# Patient Record
Sex: Female | Born: 1995
Health system: Southern US, Community
[De-identification: ages and names within clinical notes are randomized; demographics above are authoritative.]

## PROBLEM LIST (undated history)

## (undated) DIAGNOSIS — F32A Depression, unspecified: Secondary | ICD-10-CM

## (undated) DIAGNOSIS — F329 Major depressive disorder, single episode, unspecified: Secondary | ICD-10-CM

## (undated) DIAGNOSIS — M419 Scoliosis, unspecified: Secondary | ICD-10-CM

## (undated) DIAGNOSIS — F419 Anxiety disorder, unspecified: Secondary | ICD-10-CM

## (undated) HISTORY — PX: ABDOMINAL SURGERY: SHX537

## (undated) HISTORY — PX: TONSILLECTOMY: SUR1361

---

## 2016-03-04 DIAGNOSIS — J209 Acute bronchitis, unspecified: Secondary | ICD-10-CM | POA: Diagnosis not present

## 2016-03-04 DIAGNOSIS — R072 Precordial pain: Secondary | ICD-10-CM | POA: Diagnosis not present

## 2016-03-26 DIAGNOSIS — Z6838 Body mass index (BMI) 38.0-38.9, adult: Secondary | ICD-10-CM | POA: Diagnosis not present

## 2016-03-26 DIAGNOSIS — K5900 Constipation, unspecified: Secondary | ICD-10-CM | POA: Diagnosis not present

## 2016-07-24 DIAGNOSIS — Z202 Contact with and (suspected) exposure to infections with a predominantly sexual mode of transmission: Secondary | ICD-10-CM | POA: Diagnosis not present

## 2016-07-24 DIAGNOSIS — Z6837 Body mass index (BMI) 37.0-37.9, adult: Secondary | ICD-10-CM | POA: Diagnosis not present

## 2016-07-24 DIAGNOSIS — N949 Unspecified condition associated with female genital organs and menstrual cycle: Secondary | ICD-10-CM | POA: Diagnosis not present

## 2016-07-25 DIAGNOSIS — Z202 Contact with and (suspected) exposure to infections with a predominantly sexual mode of transmission: Secondary | ICD-10-CM | POA: Diagnosis not present

## 2016-07-25 DIAGNOSIS — N949 Unspecified condition associated with female genital organs and menstrual cycle: Secondary | ICD-10-CM | POA: Diagnosis not present

## 2016-07-29 DIAGNOSIS — Z202 Contact with and (suspected) exposure to infections with a predominantly sexual mode of transmission: Secondary | ICD-10-CM | POA: Diagnosis not present

## 2016-08-15 DIAGNOSIS — J029 Acute pharyngitis, unspecified: Secondary | ICD-10-CM | POA: Diagnosis not present

## 2016-08-15 DIAGNOSIS — Z6837 Body mass index (BMI) 37.0-37.9, adult: Secondary | ICD-10-CM | POA: Diagnosis not present

## 2016-08-15 DIAGNOSIS — J069 Acute upper respiratory infection, unspecified: Secondary | ICD-10-CM | POA: Diagnosis not present

## 2016-11-18 DIAGNOSIS — Z113 Encounter for screening for infections with a predominantly sexual mode of transmission: Secondary | ICD-10-CM | POA: Diagnosis not present

## 2016-11-18 DIAGNOSIS — Z01419 Encounter for gynecological examination (general) (routine) without abnormal findings: Secondary | ICD-10-CM | POA: Diagnosis not present

## 2016-11-18 DIAGNOSIS — Z6838 Body mass index (BMI) 38.0-38.9, adult: Secondary | ICD-10-CM | POA: Diagnosis not present

## 2016-12-12 DIAGNOSIS — R072 Precordial pain: Secondary | ICD-10-CM | POA: Diagnosis not present

## 2016-12-12 DIAGNOSIS — Z6837 Body mass index (BMI) 37.0-37.9, adult: Secondary | ICD-10-CM | POA: Diagnosis not present

## 2016-12-16 DIAGNOSIS — R87610 Atypical squamous cells of undetermined significance on cytologic smear of cervix (ASC-US): Secondary | ICD-10-CM | POA: Diagnosis not present

## 2016-12-16 DIAGNOSIS — R87810 Cervical high risk human papillomavirus (HPV) DNA test positive: Secondary | ICD-10-CM | POA: Diagnosis not present

## 2016-12-16 DIAGNOSIS — Z6837 Body mass index (BMI) 37.0-37.9, adult: Secondary | ICD-10-CM | POA: Diagnosis not present

## 2017-02-04 DIAGNOSIS — R5382 Chronic fatigue, unspecified: Secondary | ICD-10-CM | POA: Diagnosis not present

## 2017-02-04 DIAGNOSIS — M329 Systemic lupus erythematosus, unspecified: Secondary | ICD-10-CM | POA: Diagnosis not present

## 2017-02-21 DIAGNOSIS — Z113 Encounter for screening for infections with a predominantly sexual mode of transmission: Secondary | ICD-10-CM | POA: Diagnosis not present

## 2017-02-21 DIAGNOSIS — Z114 Encounter for screening for human immunodeficiency virus [HIV]: Secondary | ICD-10-CM | POA: Diagnosis not present

## 2017-03-15 DIAGNOSIS — R102 Pelvic and perineal pain: Secondary | ICD-10-CM | POA: Diagnosis not present

## 2017-03-15 DIAGNOSIS — Z6837 Body mass index (BMI) 37.0-37.9, adult: Secondary | ICD-10-CM | POA: Diagnosis not present

## 2017-05-05 DIAGNOSIS — L03211 Cellulitis of face: Secondary | ICD-10-CM | POA: Diagnosis not present

## 2017-05-05 DIAGNOSIS — Z6837 Body mass index (BMI) 37.0-37.9, adult: Secondary | ICD-10-CM | POA: Diagnosis not present

## 2017-05-18 DIAGNOSIS — Z882 Allergy status to sulfonamides status: Secondary | ICD-10-CM | POA: Diagnosis not present

## 2017-05-18 DIAGNOSIS — Z3A09 9 weeks gestation of pregnancy: Secondary | ICD-10-CM | POA: Diagnosis not present

## 2017-05-18 DIAGNOSIS — O009 Unspecified ectopic pregnancy without intrauterine pregnancy: Secondary | ICD-10-CM | POA: Diagnosis not present

## 2017-05-18 DIAGNOSIS — N838 Other noninflammatory disorders of ovary, fallopian tube and broad ligament: Secondary | ICD-10-CM | POA: Diagnosis not present

## 2017-05-18 DIAGNOSIS — O209 Hemorrhage in early pregnancy, unspecified: Secondary | ICD-10-CM | POA: Diagnosis not present

## 2017-05-18 DIAGNOSIS — O26891 Other specified pregnancy related conditions, first trimester: Secondary | ICD-10-CM | POA: Diagnosis not present

## 2017-05-18 DIAGNOSIS — Z3A01 Less than 8 weeks gestation of pregnancy: Secondary | ICD-10-CM | POA: Diagnosis not present

## 2017-05-18 DIAGNOSIS — R109 Unspecified abdominal pain: Secondary | ICD-10-CM | POA: Diagnosis not present

## 2017-05-19 DIAGNOSIS — N838 Other noninflammatory disorders of ovary, fallopian tube and broad ligament: Secondary | ICD-10-CM | POA: Diagnosis not present

## 2017-05-19 DIAGNOSIS — O209 Hemorrhage in early pregnancy, unspecified: Secondary | ICD-10-CM | POA: Diagnosis not present

## 2017-05-19 DIAGNOSIS — R109 Unspecified abdominal pain: Secondary | ICD-10-CM | POA: Diagnosis not present

## 2017-05-19 DIAGNOSIS — O26891 Other specified pregnancy related conditions, first trimester: Secondary | ICD-10-CM | POA: Diagnosis not present

## 2017-05-22 DIAGNOSIS — Z3A00 Weeks of gestation of pregnancy not specified: Secondary | ICD-10-CM | POA: Diagnosis not present

## 2017-05-22 DIAGNOSIS — O26859 Spotting complicating pregnancy, unspecified trimester: Secondary | ICD-10-CM | POA: Diagnosis not present

## 2017-05-23 ENCOUNTER — Encounter (HOSPITAL_COMMUNITY): Payer: Self-pay

## 2017-05-23 DIAGNOSIS — K5900 Constipation, unspecified: Secondary | ICD-10-CM | POA: Diagnosis not present

## 2017-05-23 DIAGNOSIS — Z91040 Latex allergy status: Secondary | ICD-10-CM | POA: Insufficient documentation

## 2017-05-23 DIAGNOSIS — R102 Pelvic and perineal pain: Secondary | ICD-10-CM | POA: Diagnosis not present

## 2017-05-23 DIAGNOSIS — Z79899 Other long term (current) drug therapy: Secondary | ICD-10-CM | POA: Diagnosis not present

## 2017-05-23 DIAGNOSIS — O208 Other hemorrhage in early pregnancy: Secondary | ICD-10-CM | POA: Diagnosis not present

## 2017-05-23 DIAGNOSIS — Z3A08 8 weeks gestation of pregnancy: Secondary | ICD-10-CM | POA: Insufficient documentation

## 2017-05-23 DIAGNOSIS — O200 Threatened abortion: Secondary | ICD-10-CM | POA: Diagnosis not present

## 2017-05-23 DIAGNOSIS — Z3A00 Weeks of gestation of pregnancy not specified: Secondary | ICD-10-CM | POA: Diagnosis not present

## 2017-05-23 DIAGNOSIS — O26899 Other specified pregnancy related conditions, unspecified trimester: Secondary | ICD-10-CM | POA: Diagnosis not present

## 2017-05-23 DIAGNOSIS — O0991 Supervision of high risk pregnancy, unspecified, first trimester: Secondary | ICD-10-CM | POA: Diagnosis not present

## 2017-05-23 DIAGNOSIS — O209 Hemorrhage in early pregnancy, unspecified: Secondary | ICD-10-CM | POA: Diagnosis not present

## 2017-05-23 LAB — HCG, QUANTITATIVE, PREGNANCY: hCG, Beta Chain, Quant, S: 541 m[IU]/mL — ABNORMAL HIGH (ref <5)

## 2017-05-23 NOTE — ED Triage Notes (Signed)
Pt reports that she is [redacted] weeks pregnant and is experiencing lower abdominal, back, and pelvic pain. She reports that she had surgery to remove a cyst in her fallopian tube on 7/9. She is also experiencing vaginal bleeding (not spotting, but not enough to fill a pad). Denies N/V/D. A&Ox4. Ambulatory.

## 2017-05-24 ENCOUNTER — Emergency Department (HOSPITAL_COMMUNITY)
Admission: EM | Admit: 2017-05-24 | Discharge: 2017-05-24 | Disposition: A | Discharge status: Home / Self Care | Payer: Medicaid Other | Attending: Emergency Medicine | Admitting: Emergency Medicine

## 2017-05-24 ENCOUNTER — Emergency Department (HOSPITAL_COMMUNITY): Payer: Medicaid Other

## 2017-05-24 DIAGNOSIS — N939 Abnormal uterine and vaginal bleeding, unspecified: Secondary | ICD-10-CM

## 2017-05-24 DIAGNOSIS — Z3490 Encounter for supervision of normal pregnancy, unspecified, unspecified trimester: Secondary | ICD-10-CM

## 2017-05-24 DIAGNOSIS — O200 Threatened abortion: Secondary | ICD-10-CM

## 2017-05-24 DIAGNOSIS — R102 Pelvic and perineal pain: Secondary | ICD-10-CM

## 2017-05-24 DIAGNOSIS — O209 Hemorrhage in early pregnancy, unspecified: Secondary | ICD-10-CM | POA: Diagnosis not present

## 2017-05-24 DIAGNOSIS — Z3A00 Weeks of gestation of pregnancy not specified: Secondary | ICD-10-CM | POA: Diagnosis not present

## 2017-05-24 LAB — CBC WITH DIFFERENTIAL/PLATELET
Basophils Absolute: 0 10*3/uL (ref 0.0–0.1)
Basophils Relative: 0 %
Eosinophils Absolute: 0.3 10*3/uL (ref 0.0–0.7)
Eosinophils Relative: 3 %
HCT: 41.2 % (ref 36.0–46.0)
Hemoglobin: 14.2 g/dL (ref 12.0–15.0)
Lymphocytes Relative: 30 %
Lymphs Abs: 3.7 10*3/uL (ref 0.7–4.0)
MCH: 29.2 pg (ref 26.0–34.0)
MCHC: 34.5 g/dL (ref 30.0–36.0)
MCV: 84.6 fL (ref 78.0–100.0)
Monocytes Absolute: 0.9 10*3/uL (ref 0.1–1.0)
Monocytes Relative: 7 %
Neutro Abs: 7.2 10*3/uL (ref 1.7–7.7)
Neutrophils Relative %: 60 %
Platelets: 293 10*3/uL (ref 150–400)
RBC: 4.87 MIL/uL (ref 3.87–5.11)
RDW: 13.6 % (ref 11.5–15.5)
WBC: 12.1 10*3/uL — ABNORMAL HIGH (ref 4.0–10.5)

## 2017-05-24 LAB — BASIC METABOLIC PANEL
Anion gap: 10 (ref 5–15)
BUN: 14 mg/dL (ref 6–20)
CO2: 22 mmol/L (ref 22–32)
Calcium: 9.4 mg/dL (ref 8.9–10.3)
Chloride: 109 mmol/L (ref 101–111)
Creatinine, Ser: 0.77 mg/dL (ref 0.44–1.00)
GFR calc Af Amer: >60 mL/min (ref >60)
GFR calc non Af Amer: >60 mL/min (ref >60)
Glucose, Bld: 106 mg/dL — ABNORMAL HIGH (ref 65–99)
Potassium: 3.5 mmol/L (ref 3.5–5.1)
Sodium: 141 mmol/L (ref 135–145)

## 2017-05-24 LAB — WET PREP, GENITAL
Sperm: NONE SEEN
Trich, Wet Prep: NONE SEEN
Yeast Wet Prep HPF POC: NONE SEEN

## 2017-05-24 LAB — ABO/RH: ABO/RH(D): O POS

## 2017-05-24 MED ORDER — KETOROLAC TROMETHAMINE 60 MG/2ML IM SOLN
60.0000 mg | Freq: Once | INTRAMUSCULAR | Status: DC
  Filled 2017-05-24: qty 2

## 2017-05-24 MED ORDER — MISOPROSTOL 200 MCG PO TABS
800.0000 ug | ORAL_TABLET | Freq: Once | ORAL | Status: AC
  Administered 2017-05-24: 800 ug via ORAL
  Filled 2017-05-24: qty 4

## 2017-05-24 MED ORDER — FENTANYL CITRATE (PF) 100 MCG/2ML IJ SOLN
50.0000 ug | Freq: Once | INTRAMUSCULAR | Status: AC
  Administered 2017-05-24: 50 ug via INTRAMUSCULAR
  Filled 2017-05-24: qty 2

## 2017-05-24 MED ORDER — NAPROXEN 500 MG PO TABS: ORAL_TABLET | ORAL | 0 refills | Status: DC

## 2017-05-24 NOTE — Discharge Instructions (Signed)
Follow up with Dr Francee PiccoloMcCloud as scheduled on Monday, July 16. If you feel worse, you can be rechecked at [facility] this weekend. You may see the gray fetal sac when it is passed and afterward your pain should be much better.

## 2017-05-24 NOTE — ED Provider Notes (Signed)
[facility]-EMERGENCY DEPT Provider Note   CSN: [id number] Arrival date & time: 05/23/17  2028  By signing my name below, I, Ny'Kea Lewis, attest that this documentation has been prepared under the direction and in the presence of Devoria AlbeKnapp, Tanith Dagostino, MD. Electronically Signed: Karren CobbleNy'Kea Lewis, ED Scribe. 05/24/17. 8:31 AM.  Time Seen 02:45 AM  History   Chief Complaint Chief Complaint  Patient presents with  . Vaginal Bleeding  . Pelvic Pain   The history is provided by the patient. No language interpreter was used.    HPI Comments: Christina Reese is a 21 y.o. female with no pertinenet history, G1P0,Ab0, LNP 5/15 who presents to the Emergency Department complaining of moderate, gradually worsening vaginal bleeding that began on July 9 . Pt notes associated cramping left lower quadrant and suprapubic abdominal pain, constipation, and pelvic pain. Pt reports vaginal blood clots, the presence of blood when wiping, as well as in the toilet when she is using the restroom. She denies filling up a pad with blood. Pt was seen on 7/9, at [facility] for a suspected ectopic pregnancy and she had a laparscopy done and she was diagnosed with a cyst on her right fallopian tube. Since, she has experienced an increasingly amount of suprapubic pain and bleeding prior to her initial onset which was spotting. She states her beta hCG was 489 in the hospital, and she was seen by her GYN on July 13 and her repeat beta hCG was around 500. She has an appointment to be reevaluated on July 16 to "discuss my options". She states she has been taking Tylenol without relief of her pain. Denies tobacco usage. Reports occasional alcohol usage prior to her pregnancy. Denies dysuria.   GYN Dr Francee PiccoloMcCloud  History reviewed. No pertinent past medical history.  There are no active problems to display for this patient.  No past surgical history on file.  OB History    No data available     Home Medications    Prior to Admission  medications   Medication Sig Start Date End Date Taking? Authorizing Provider  acetaminophen (TYLENOL) 500 MG tablet Take 1,000 mg by mouth every 6 (six) hours as needed for mild pain, moderate pain or headache.   Yes [provider]  ibuprofen (ADVIL,MOTRIN) 800 MG tablet Take 800 mg by mouth every 8 (eight) hours as needed for headache, mild pain, moderate pain or cramping.   Yes [provider]  Prenatal Vit-Fe Fumarate-FA (PNV PRENATAL PLUS MULTIVITAMIN) 27-1 MG TABS Take 1 tablet by mouth daily. 05/08/17  Yes [provider]  naproxen (NAPROSYN) 500 MG tablet Take 1 po BID with food prn pain 05/24/17   Devoria AlbeKnapp, Draper Gallon, MD   Family History History reviewed. No pertinent family history.  Social History Social History  Substance Use Topics  . Smoking status: Not on file  . Smokeless tobacco: Not on file  . Alcohol use Not on file  unemployed Nonsmoker Drank alcohol prior to knowing she was pregnant.   Allergies   Latex and Sulfa antibiotics  Review of Systems Review of Systems  Gastrointestinal: Positive for abdominal pain and constipation.  Genitourinary: Positive for frequency, pelvic pain and vaginal bleeding. Negative for dysuria.  All other systems reviewed and are negative.  Physical Exam Updated Vital Signs BP 117/84 (BP Location: Left Arm)   Pulse (!) 105   Temp 98.9 F (37.2 C) (Oral)   Resp 18   SpO2 100%   Vital signs normal except for tachycardia  Physical Exam  Constitutional: She is oriented to person, place, and time. She appears well-developed and well-nourished.  Non-toxic appearance. She does not appear ill. No distress.  HENT:  Head: Normocephalic and atraumatic.  Right Ear: External ear normal.  Left Ear: External ear normal.  Nose: Nose normal. No mucosal edema or rhinorrhea.  Mouth/Throat: Oropharynx is clear and moist and mucous membranes are normal. No dental abscesses or uvula swelling.  Eyes: Pupils are equal, round,  and reactive to light. Conjunctivae and EOM are normal.  Neck: Normal range of motion and full passive range of motion without pain. Neck supple.  Cardiovascular: Normal rate, regular rhythm and normal heart sounds.  Exam reveals no gallop and no friction rub.   No murmur heard. Pulmonary/Chest: Effort normal and breath sounds normal. No respiratory distress. She has no wheezes. She has no rhonchi. She has no rales. She exhibits no tenderness and no crepitus.  Abdominal: Soft. Normal appearance and bowel sounds are normal. She exhibits no distension. There is tenderness in the suprapubic area and left lower quadrant. There is no rebound and no guarding.    Genitourinary: Vagina normal.  Genitourinary Comments: Chaperone present. Normal external genital. Small amount of blood in the vault with a small aqmount of blood oozing out of the oz. Uterus is tender to palpation. She is very tender in the right adnexa but not to the left.   Musculoskeletal: Normal range of motion. She exhibits no edema or tenderness.  Moves all extremities well.   Neurological: She is alert and oriented to person, place, and time. She has normal strength. No cranial nerve deficit.  Skin: Skin is warm, dry and intact. No rash noted. No erythema. No pallor.  Psychiatric: She has a normal mood and affect. Her speech is normal and behavior is normal. Her mood appears not anxious.  Nursing note and vitals reviewed.  ED Treatments / Results  DIAGNOSTIC STUDIES: Oxygen Saturation is 100% on RA, normal by my interpretation.   Labs (all labs ordered are listed, but only abnormal results are displayed) Results for orders placed or performed during the hospital encounter of 05/24/17  Wet prep, genital  Result Value Ref Range   Yeast Wet Prep HPF POC NONE SEEN NONE SEEN   Trich, Wet Prep NONE SEEN NONE SEEN   Clue Cells Wet Prep HPF POC PRESENT (A) NONE SEEN   WBC, Wet Prep HPF POC MANY (A) NONE SEEN   Sperm NONE SEEN     hCG, quantitative, pregnancy  Result Value Ref Range   hCG, Beta Chain, Quant, S 541 (H) <5 mIU/mL  Basic metabolic panel  Result Value Ref Range   Sodium 141 135 - 145 mmol/L   Potassium 3.5 3.5 - 5.1 mmol/L   Chloride 109 101 - 111 mmol/L   CO2 22 22 - 32 mmol/L   Glucose, Bld 106 (H) 65 - 99 mg/dL   BUN 14 6 - 20 mg/dL   Creatinine, Ser 1.61 0.44 - 1.00 mg/dL   Calcium 9.4 8.9 - 09.6 mg/dL   GFR calc non Af Amer >60 >60 mL/min   GFR calc Af Amer >60 >60 mL/min   Anion gap 10 5 - 15  CBC with Differential  Result Value Ref Range   WBC 12.1 (H) 4.0 - 10.5 K/uL   RBC 4.87 3.87 - 5.11 MIL/uL   Hemoglobin 14.2 12.0 - 15.0 g/dL   HCT 04.5 40.9 - 81.1 %   MCV 84.6 78.0 - 100.0 fL  MCH 29.2 26.0 - 34.0 pg   MCHC 34.5 30.0 - 36.0 g/dL   RDW 16.1 09.6 - 04.5 %   Platelets 293 150 - 400 K/uL   Neutrophils Relative % 60 %   Neutro Abs 7.2 1.7 - 7.7 K/uL   Lymphocytes Relative 30 %   Lymphs Abs 3.7 0.7 - 4.0 K/uL   Monocytes Relative 7 %   Monocytes Absolute 0.9 0.1 - 1.0 K/uL   Eosinophils Relative 3 %   Eosinophils Absolute 0.3 0.0 - 0.7 K/uL   Basophils Relative 0 %   Basophils Absolute 0.0 0.0 - 0.1 K/uL  ABO/Rh  Result Value Ref Range   ABO/RH(D) O POS    Laboratory interpretation all normal except + bHCG    EKG  EKG Interpretation None      Radiology US Ob Comp Less 14 Wks US Ob Transvaginal  Result Date: 05/24/2017 CLINICAL DATA:  Vaginal bleeding EXAM: OBSTETRIC <14 WK [place] AND TRANSVAGINAL OB US TECHNIQUE: Both transabdominal and transvaginal ultrasound examinations were performed for complete evaluation of the gestation as well as the maternal uterus, adnexal regions, and pelvic cul-de-sac. Transvaginal technique was performed to assess early pregnancy. COMPARISON:  None. FINDINGS: Intrauterine gestational sac: Small irregular fluid collection in the lower uterine segment. Yolk sac:  Not visualized Embryo:  Not visualized Cardiac Activity: Not visualized  Subchorionic hemorrhage:  None visualized. Maternal uterus/adnexae: Right ovary measures 2.6 x 1.4 x 1.8 cm. Possible para ovarian cyst measuring 1.4 cm. Left ovary measures 3.5 x 1.7 x 2.4 cm. Trace free fluid. Thickened heterogenous endometrium IMPRESSION: 1. Irregular focal fluid collection within the lower uterine segment is equivocal for a gestational sac but is low in position. Suggest trending of beta HCG with repeat ultrasound as clinically indicated. 2. Trace free fluid in the pelvis. Electronically Signed   By: Jasmine Pang M.D.   On: 05/24/2017 04:13    Procedures Procedures (including critical care time)  Medications Ordered in ED Medications  ketorolac (TORADOL) injection 60 mg (0 mg Intramuscular Hold 05/24/17 0610)  misoprostol (CYTOTEC) tablet 800 mcg (800 mcg Oral Given 05/24/17 0515)  fentaNYL (SUBLIMAZE) injection 50 mcg (50 mcg Intramuscular Given 05/24/17 0515)     Initial Impression / Assessment and Plan / ED Course  I have reviewed the triage vital signs and the nursing notes.  Pertinent labs & imaging results that were available during my care of the patient were reviewed by me and considered in my medical decision making (see chart for details).     COORDINATION OF CARE: 3:06 AM-Discussed next steps with pt. Pt verbalized understanding and is agreeable with the plan. Pelvic US ordered.   4:47 AM Dr. Despina Hidden, OB on call at [facility], recommends giving cytotec 800 mcg SL and something for pain to help her pass the pregnancy  04:50 AM discussed her [place] results and Dr Forestine Chute recommendation, she is agreeable, states Dr Francee Piccolo had felt she would lose the pregnancy, she wants to get it over with.   Patient was given medications, she continued to have a lot of cramping and she was given Toradol IM.  At time of discharge she states her discomfort is better. We discussed what to look for, tissue is gray and she can be rechecked [facility] she gets worse over the weekend, she is  going to follow up with her OB/GYN on Monday, July 16.  Final Clinical Impressions(s) / ED Diagnoses   Final diagnoses:  Pregnancy  Vaginal bleeding  Pelvic pain  Threatened miscarriage    New Prescriptions Discharge Medication List as of 05/24/2017  7:41 AM    START taking these medications   Details  naproxen (NAPROSYN) 500 MG tablet Take 1 po BID with food prn pain, Print       Plan discharge  Devoria Albe, MD, FACEP   I personally performed the services described in this documentation, which was scribed in my presence. The recorded information has been reviewed and considered.  Devoria Albe, MD, Concha Pyo, MD 05/24/17 [id number]

## 2017-05-24 NOTE — ED Notes (Signed)
PT RESTING. NO ACTIVE BLEEDING AT THIS TIME. PAIN 3/10. MOTHER AT THE BEDSIDE.

## 2017-05-24 NOTE — ED Notes (Signed)
PT C/O INCREASED LOWER ABDOMINAL CRAMPING AND HEAVY VAGINAL BLEEDING. DR. Lynelle DoctorKNAPP MADE AWARE.

## 2017-05-26 DIAGNOSIS — O209 Hemorrhage in early pregnancy, unspecified: Secondary | ICD-10-CM | POA: Diagnosis not present

## 2017-05-26 LAB — GC/CHLAMYDIA PROBE AMP (~~LOC~~) NOT AT ARMC
Chlamydia: NEGATIVE
Neisseria Gonorrhea: NEGATIVE

## 2017-07-03 DIAGNOSIS — Z6837 Body mass index (BMI) 37.0-37.9, adult: Secondary | ICD-10-CM | POA: Diagnosis not present

## 2017-07-03 DIAGNOSIS — R51 Headache: Secondary | ICD-10-CM | POA: Diagnosis not present

## 2017-07-03 DIAGNOSIS — R102 Pelvic and perineal pain: Secondary | ICD-10-CM | POA: Diagnosis not present

## 2017-07-11 DIAGNOSIS — R102 Pelvic and perineal pain: Secondary | ICD-10-CM | POA: Diagnosis not present

## 2017-07-11 DIAGNOSIS — R938 Abnormal findings on diagnostic imaging of other specified body structures: Secondary | ICD-10-CM | POA: Diagnosis not present

## 2017-07-25 DIAGNOSIS — Z114 Encounter for screening for human immunodeficiency virus [HIV]: Secondary | ICD-10-CM | POA: Diagnosis not present

## 2017-07-25 DIAGNOSIS — Z113 Encounter for screening for infections with a predominantly sexual mode of transmission: Secondary | ICD-10-CM | POA: Diagnosis not present

## 2017-09-05 DIAGNOSIS — R102 Pelvic and perineal pain: Secondary | ICD-10-CM | POA: Diagnosis not present

## 2017-09-05 DIAGNOSIS — R9389 Abnormal findings on diagnostic imaging of other specified body structures: Secondary | ICD-10-CM | POA: Diagnosis not present

## 2017-10-08 ENCOUNTER — Emergency Department (HOSPITAL_COMMUNITY)
Admission: EM | Admit: 2017-10-08 | Discharge: 2017-10-09 | Disposition: A | Discharge status: Home / Self Care | Payer: BLUE CROSS/BLUE SHIELD | Attending: Emergency Medicine | Admitting: Emergency Medicine

## 2017-10-08 ENCOUNTER — Encounter (HOSPITAL_COMMUNITY): Payer: Self-pay | Admitting: *Deleted

## 2017-10-08 ENCOUNTER — Other Ambulatory Visit: Payer: Self-pay

## 2017-10-08 DIAGNOSIS — L03311 Cellulitis of abdominal wall: Secondary | ICD-10-CM | POA: Insufficient documentation

## 2017-10-08 DIAGNOSIS — F17200 Nicotine dependence, unspecified, uncomplicated: Secondary | ICD-10-CM | POA: Insufficient documentation

## 2017-10-08 DIAGNOSIS — R1909 Other intra-abdominal and pelvic swelling, mass and lump: Secondary | ICD-10-CM | POA: Diagnosis not present

## 2017-10-08 DIAGNOSIS — L0390 Cellulitis, unspecified: Secondary | ICD-10-CM

## 2017-10-08 DIAGNOSIS — S20161A Insect bite (nonvenomous) of breast, right breast, initial encounter: Secondary | ICD-10-CM | POA: Diagnosis not present

## 2017-10-08 DIAGNOSIS — N61 Mastitis without abscess: Secondary | ICD-10-CM | POA: Diagnosis not present

## 2017-10-08 DIAGNOSIS — W57XXXA Bitten or stung by nonvenomous insect and other nonvenomous arthropods, initial encounter: Secondary | ICD-10-CM

## 2017-10-08 DIAGNOSIS — Z91040 Latex allergy status: Secondary | ICD-10-CM | POA: Diagnosis not present

## 2017-10-08 DIAGNOSIS — S30861A Insect bite (nonvenomous) of abdominal wall, initial encounter: Secondary | ICD-10-CM | POA: Diagnosis not present

## 2017-10-08 LAB — POC URINE PREG, ED: Preg Test, Ur: NEGATIVE

## 2017-10-08 MED ORDER — CEPHALEXIN 500 MG PO CAPS: 500.0000 mg | ORAL_CAPSULE | Freq: Four times a day (QID) | ORAL | 0 refills | Status: DC

## 2017-10-08 NOTE — ED Triage Notes (Signed)
The pt thinks she was bitten by a spider on her rt abdomen and her rt breast.  She saw a spider in her bed   Redness and swelling  lmp nov 22

## 2017-10-08 NOTE — Discharge Instructions (Signed)
You can take Tylenol or Ibuprofen as directed for pain. You can alternate Tylenol and Ibuprofen every 4 hours. If you take Tylenol at 1pm, then you can take Ibuprofen at 5pm. Then you can take Tylenol again at 9pm.   Take antibiotics as directed. Please take all of your antibiotics until finished.  As we discussed, you will need to follow-up with the [facility] [place].  Call their office and arrange for an appointment for evaluation of the potential abscess on the breast.  Return the emergency department for any worsening pain, fever, worsening redness,, drainage from the site or any other worsening or concerning symptoms.

## 2017-10-08 NOTE — ED Provider Notes (Signed)
[facility] EMERGENCY [facility] Provider Note   CSN: [id number] Arrival date & time: 10/08/17  1948     History   Chief Complaint Chief Complaint  Patient presents with  . Abscess    HPI Christina Reese is a 21 y.o. female who presents with an area of redness to her abdomen in the area of redness to her right breast that began yesterday.  Patient reports that she thinks that she was bitten by a spider.  She states that the symptoms initially started when she was in bed and when she moved, she saw a spider.  Patient reports that since then she has had areas of increasing redness and pain to the right abdomen into the right breast.  She has not noticed any drainage from either sites.  Patient has not been taking any medications for the pain.  Patient denies any fevers, chills, nipple discharge.   The history is provided by the patient.    History reviewed. No pertinent past medical history.  There are no active problems to display for this patient.   History reviewed. No pertinent surgical history.  OB History    No data available       Home Medications    Prior to Admission medications   Medication Sig Start Date End Date Taking? Authorizing Provider  acetaminophen (TYLENOL) 500 MG tablet Take 1,000 mg by mouth every 6 (six) hours as needed for mild pain, moderate pain or headache.    [provider]  cephALEXin (KEFLEX) 500 MG capsule Take 1 capsule (500 mg total) by mouth 4 (four) times daily. 10/08/17   Maxwell CaulLayden, Aadam Zhen A, PA-C  ibuprofen (ADVIL,MOTRIN) 800 MG tablet Take 800 mg by mouth every 8 (eight) hours as needed for headache, mild pain, moderate pain or cramping.    [provider]  naproxen (NAPROSYN) 500 MG tablet Take 1 po BID with food prn pain 05/24/17   Devoria AlbeKnapp, Iva, MD  Prenatal Vit-Fe Fumarate-FA (PNV PRENATAL PLUS MULTIVITAMIN) 27-1 MG TABS Take 1 tablet by mouth daily. 05/08/17   [provider]    Family  History No family history on file.  Social History Social History   Tobacco Use  . Smoking status: Current Every Day Smoker  . Smokeless tobacco: Never Used  Substance Use Topics  . Alcohol use: Yes  . Drug use: Not on file     Allergies   Latex and Sulfa antibiotics   Review of Systems Review of Systems  Constitutional: Negative for fever.  Skin: Positive for color change and wound.     Physical Exam Updated Vital Signs BP 115/78 (BP Location: Right Arm)   Pulse 86   Temp 98.5 F (36.9 C) (Oral)   Resp 18   Ht 5\' 3"  (1.6 m)   Wt 99.8 kg (220 lb)   LMP 10/02/2017   SpO2 98%   BMI 38.97 kg/m   Physical Exam  Constitutional: She appears well-developed and well-nourished.  HENT:  Head: Normocephalic and atraumatic.  Eyes: Conjunctivae and EOM are normal. Right eye exhibits no discharge. Left eye exhibits no discharge. No scleral icterus.  Pulmonary/Chest: Effort normal.    Neurological: She is alert.  Skin: Skin is warm and dry.  5 cm area of erythema and warmth noted to the lateral aspect of the right abdomen.  There is a central bite mark noted.  No active drainage.  No fluctuance, induration.  Psychiatric: She has a normal mood and affect. Her speech is normal  and behavior is normal.  Nursing note and vitals reviewed.    ED Treatments / Results  Labs (all labs ordered are listed, but only abnormal results are displayed) Labs Reviewed  POC URINE PREG, ED    EKG  EKG Interpretation None       Radiology No results found.  Procedures Procedures (including critical care time)  Medications Ordered in ED Medications - No data to display   Initial Impression / Assessment and Plan / ED Course  I have reviewed the triage vital signs and the nursing notes.  Pertinent labs & imaging results that were available during my care of the patient were reviewed by me and considered in my medical decision making (see chart for details).      21 year old female who presents with redness patient reports seeing a spider and thinks it may have bitten her.  No fevers.  No nipple discharge. Patient is afebrile, non-toxic appearing, sitting comfortably on examination table. Vital signs reviewed and stable.  Physical exam shows evidence of a spider bite to the right abdomen surrounding warmth and erythema.  No area of fluctuance.  No evidence of abscess.  On the right lateral breast, there is an area of erythema with a central area of fluctuance and no active drainage at this time.  Given concerns for abscess on right breast, do not feel that the ED is the appropriate place for incision and drainage of the breast.  History/physical exam are not concerning for breast mass or mastitis but given limited exam availability in the emergency department, cannot rule out breast mass.   I discussed risk first benefits of I&D at this time with the patient.  I feel that this is best treated at the [facility] [place].  Patient is in agreement to plan.  We will plan to start patient on antibiotic therapy.  Patient does have a history of allergy to sulfa antibiotics.  She has been on penicillins without any issue.  Instructed patient to call referred breast center tomorrow for further evaluation. Patient had ample opportunity for questions and discussion. All patient's questions were answered with full understanding. Strict return precautions discussed. Patient expresses understanding and agreement to plan.    Final Clinical Impressions(s) / ED Diagnoses   Final diagnoses:  Cellulitis, unspecified cellulitis site  Insect bite, initial encounter    ED Discharge Orders        Ordered    cephALEXin (KEFLEX) 500 MG capsule  4 times daily     10/08/17 2346       Maxwell CaulLayden, Rumaldo Difatta A, PA-C 10/09/17 0335    Arby BarrettePfeiffer, Marcy, MD 10/12/17 1840

## 2017-10-10 ENCOUNTER — Other Ambulatory Visit: Payer: Self-pay | Admitting: Family Medicine

## 2017-10-10 DIAGNOSIS — L089 Local infection of the skin and subcutaneous tissue, unspecified: Secondary | ICD-10-CM

## 2017-10-10 DIAGNOSIS — S20161A Insect bite (nonvenomous) of breast, right breast, initial encounter: Principal | ICD-10-CM

## 2017-10-10 DIAGNOSIS — W57XXXA Bitten or stung by nonvenomous insect and other nonvenomous arthropods, initial encounter: Principal | ICD-10-CM

## 2017-10-11 ENCOUNTER — Emergency Department (HOSPITAL_COMMUNITY)
Admission: EM | Admit: 2017-10-11 | Discharge: 2017-10-11 | Disposition: A | Discharge status: Home / Self Care | Payer: BLUE CROSS/BLUE SHIELD | Attending: Emergency Medicine | Admitting: Emergency Medicine

## 2017-10-11 ENCOUNTER — Encounter (HOSPITAL_COMMUNITY): Payer: Self-pay | Admitting: Nurse Practitioner

## 2017-10-11 DIAGNOSIS — L03311 Cellulitis of abdominal wall: Secondary | ICD-10-CM | POA: Diagnosis not present

## 2017-10-11 DIAGNOSIS — F17210 Nicotine dependence, cigarettes, uncomplicated: Secondary | ICD-10-CM | POA: Insufficient documentation

## 2017-10-11 DIAGNOSIS — Z91040 Latex allergy status: Secondary | ICD-10-CM | POA: Diagnosis not present

## 2017-10-11 DIAGNOSIS — Z79899 Other long term (current) drug therapy: Secondary | ICD-10-CM | POA: Diagnosis not present

## 2017-10-11 DIAGNOSIS — N644 Mastodynia: Secondary | ICD-10-CM | POA: Diagnosis not present

## 2017-10-11 DIAGNOSIS — N61 Mastitis without abscess: Secondary | ICD-10-CM | POA: Insufficient documentation

## 2017-10-11 MED ORDER — DOXYCYCLINE HYCLATE 100 MG PO CAPS: 100.0000 mg | ORAL_CAPSULE | Freq: Two times a day (BID) | ORAL | 0 refills | Status: DC

## 2017-10-11 NOTE — ED Provider Notes (Signed)
[facility]-EMERGENCY DEPT Provider Note   CSN: [id number] Arrival date & time: 10/11/17  1849     History   Chief Complaint Chief Complaint  Patient presents with  . Cellulitis    HPI Christina Reese is a 21 y.o. female who presents to the ED with pain and redness to the right breast and abdomen. Patient evaluated 2 days ago for same and started on antibiotics but the symptoms have not improved. Patient saw a spider in her bed at the time of the initial problem.  HPI  History reviewed. No pertinent past medical history.  There are no active problems to display for this patient.   History reviewed. No pertinent surgical history.  OB History    No data available       Home Medications    Prior to Admission medications   Medication Sig Start Date End Date Taking? Authorizing Provider  acetaminophen (TYLENOL) 500 MG tablet Take 1,000 mg by mouth every 6 (six) hours as needed for mild pain, moderate pain or headache.    [provider]  doxycycline (VIBRAMYCIN) 100 MG capsule Take 1 capsule (100 mg total) by mouth 2 (two) times daily. 10/11/17   Janne NapoleonNeese, Shanan Fitzpatrick M, NP  ibuprofen (ADVIL,MOTRIN) 800 MG tablet Take 800 mg by mouth every 8 (eight) hours as needed for headache, mild pain, moderate pain or cramping.    [provider]  naproxen (NAPROSYN) 500 MG tablet Take 1 po BID with food prn pain 05/24/17   Devoria AlbeKnapp, Iva, MD  Prenatal Vit-Fe Fumarate-FA (PNV PRENATAL PLUS MULTIVITAMIN) 27-1 MG TABS Take 1 tablet by mouth daily. 05/08/17   [provider]    Family History History reviewed. No pertinent family history.  Social History Social History   Tobacco Use  . Smoking status: Current Every Day Smoker  . Smokeless tobacco: Never Used  Substance Use Topics  . Alcohol use: Yes  . Drug use: Not on file     Allergies   Latex and Sulfa antibiotics   Review of Systems Review of Systems  Constitutional: Negative for chills  and fever.  HENT: Negative.   Respiratory: Negative for shortness of breath.   Gastrointestinal: Negative for nausea and vomiting. Abdominal pain: at site of redness.  Genitourinary: Negative for frequency.  Musculoskeletal: Positive for arthralgias.  Skin: Positive for color change and wound.  Neurological: Negative for syncope and headaches.  Psychiatric/Behavioral: Negative for confusion.     Physical Exam Updated Vital Signs BP 114/81 (BP Location: Right Arm)   Pulse (!) 118   Temp 99.7 F (37.6 C) (Oral)   Resp 14   LMP 10/02/2017   SpO2 100%   Physical Exam  Constitutional: She is oriented to person, place, and time. She appears well-developed and well-nourished. No distress.  Eyes: EOM are normal.  Neck: Neck supple.  Cardiovascular: Normal rate.  Pulmonary/Chest: Effort normal. Right breast exhibits skin change and tenderness.  There is a 4 cm area of erythema to the right lateral breast with central raised open area with tiny amount of drainage. There area is tender on palpation.   Abdominal:  There is a 5 cm area of erythema to the right upper abdomen that is firm and tender on exam.   Genitourinary: No breast discharge.  Musculoskeletal: Normal range of motion.  Neurological: She is alert and oriented to person, place, and time. No cranial nerve deficit.  Skin: There is erythema.  Right breast and right side abdomen with areas of  erythema.  Psychiatric: She has a normal mood and affect.  Nursing note and vitals reviewed.    ED Treatments / Results  Labs (all labs ordered are listed, but only abnormal results are displayed) Labs Reviewed - No data to display  EKG Radiology No results found.  Procedures Procedures (including critical care time)  Medications Ordered in ED Medications - No data to display   Initial Impression / Assessment and Plan / ED Course  I have reviewed the triage vital signs and the nursing notes. 21 y.o. female with erythema  and tenderness to the right breast and right side of abdomen that has not improved with Keflex returns for recheck. She does have an appointment with the Breast Center for f/u for the breast. Will change antibiotic to Doxycycline and patient will f/u with her PCP in 2 day for recheck. She will take ibuprofen for pain and inflammation and apply warm wet compresses to the areas. Return precautions discussed.   Final Clinical Impressions(s) / ED Diagnoses   Final diagnoses:  Cellulitis of right breast  Cellulitis, abdominal wall    ED Discharge Orders        Ordered    doxycycline (VIBRAMYCIN) 100 MG capsule  2 times daily     10/11/17 2100       Kerrie Buffaloeese, Gizell Danser NewarkM, [place]NP 10/11/17 2111    Tegeler, Canary Brimhristopher J, MD 10/11/17 [id number]

## 2017-10-11 NOTE — Discharge Instructions (Signed)
Apply warm wet compresses several times a day. Take your ibuprofen and antibiotic as directed. Follow up with your doctor and with the Breast Center as scheduled. Return here as needed.

## 2017-10-11 NOTE — ED Triage Notes (Signed)
Pt presents with 2 skin lesions consistent with cellulitis, one on her RLQ quadrant of her abdomen and the other one her right breast. She was evaluated for both on the 28th Nov /2 days ago, states they both have worsened despite taking the abx prescribed.

## 2017-10-15 ENCOUNTER — Ambulatory Visit
Admission: RE | Admit: 2017-10-15 | Discharge: 2017-10-15 | Disposition: A | Discharge status: Home / Self Care | Payer: BLUE CROSS/BLUE SHIELD | Source: Ambulatory Visit | Attending: Family Medicine | Admitting: Family Medicine

## 2017-10-15 DIAGNOSIS — W57XXXA Bitten or stung by nonvenomous insect and other nonvenomous arthropods, initial encounter: Principal | ICD-10-CM

## 2017-10-15 DIAGNOSIS — L089 Local infection of the skin and subcutaneous tissue, unspecified: Secondary | ICD-10-CM

## 2017-10-15 DIAGNOSIS — S20161A Insect bite (nonvenomous) of breast, right breast, initial encounter: Principal | ICD-10-CM

## 2017-10-15 DIAGNOSIS — N6489 Other specified disorders of breast: Secondary | ICD-10-CM | POA: Diagnosis not present

## 2017-10-17 DIAGNOSIS — L03313 Cellulitis of chest wall: Secondary | ICD-10-CM | POA: Diagnosis not present

## 2017-10-17 DIAGNOSIS — Z6837 Body mass index (BMI) 37.0-37.9, adult: Secondary | ICD-10-CM | POA: Diagnosis not present

## 2017-10-17 DIAGNOSIS — F330 Major depressive disorder, recurrent, mild: Secondary | ICD-10-CM | POA: Diagnosis not present

## 2017-10-17 DIAGNOSIS — L03311 Cellulitis of abdominal wall: Secondary | ICD-10-CM | POA: Diagnosis not present

## 2017-11-21 DIAGNOSIS — F411 Generalized anxiety disorder: Secondary | ICD-10-CM | POA: Diagnosis not present

## 2017-11-21 DIAGNOSIS — Z6837 Body mass index (BMI) 37.0-37.9, adult: Secondary | ICD-10-CM | POA: Diagnosis not present

## 2017-11-27 DIAGNOSIS — F410 Panic disorder [episodic paroxysmal anxiety] without agoraphobia: Secondary | ICD-10-CM | POA: Diagnosis not present

## 2017-11-27 DIAGNOSIS — Z6837 Body mass index (BMI) 37.0-37.9, adult: Secondary | ICD-10-CM | POA: Diagnosis not present

## 2017-12-01 DIAGNOSIS — R102 Pelvic and perineal pain: Secondary | ICD-10-CM | POA: Diagnosis not present

## 2017-12-02 ENCOUNTER — Other Ambulatory Visit: Payer: Self-pay | Admitting: Obstetrics and Gynecology

## 2017-12-02 DIAGNOSIS — G8929 Other chronic pain: Secondary | ICD-10-CM

## 2017-12-02 DIAGNOSIS — R102 Pelvic and perineal pain: Principal | ICD-10-CM

## 2017-12-09 ENCOUNTER — Ambulatory Visit
Admission: RE | Admit: 2017-12-09 | Discharge: 2017-12-09 | Disposition: A | Discharge status: Home / Self Care | Payer: BLUE CROSS/BLUE SHIELD | Source: Ambulatory Visit | Attending: Obstetrics and Gynecology | Admitting: Obstetrics and Gynecology

## 2017-12-09 DIAGNOSIS — R102 Pelvic and perineal pain: Secondary | ICD-10-CM | POA: Diagnosis not present

## 2017-12-09 DIAGNOSIS — G8929 Other chronic pain: Secondary | ICD-10-CM

## 2017-12-09 MED ORDER — IOPAMIDOL (ISOVUE-300) INJECTION 61%
100.0000 mL | Freq: Once | INTRAVENOUS | Status: AC | PRN
  Administered 2017-12-09: 100 mL via INTRAVENOUS

## 2017-12-17 ENCOUNTER — Other Ambulatory Visit: Payer: Self-pay

## 2017-12-17 DIAGNOSIS — L509 Urticaria, unspecified: Secondary | ICD-10-CM | POA: Diagnosis not present

## 2017-12-17 DIAGNOSIS — Z5321 Procedure and treatment not carried out due to patient leaving prior to being seen by health care provider: Secondary | ICD-10-CM | POA: Insufficient documentation

## 2017-12-17 NOTE — ED Triage Notes (Signed)
Pt reports dying her hair on Sunday and states that she has had hives and mild swelling to the right side if her face. States she has been taking benadryl at home without relief.

## 2017-12-17 NOTE — ED Notes (Signed)
Pt remains in waiting room. Updated on wait for treatment room. 

## 2017-12-18 ENCOUNTER — Ambulatory Visit (HOSPITAL_COMMUNITY)
Admission: EM | Admit: 2017-12-18 | Discharge: 2017-12-18 | Disposition: A | Discharge status: Home / Self Care | Payer: BLUE CROSS/BLUE SHIELD | Attending: Family Medicine | Admitting: Family Medicine

## 2017-12-18 ENCOUNTER — Encounter (HOSPITAL_COMMUNITY): Payer: Self-pay | Admitting: Emergency Medicine

## 2017-12-18 ENCOUNTER — Emergency Department (HOSPITAL_COMMUNITY)
Admission: EM | Admit: 2017-12-18 | Discharge: 2017-12-18 | Discharge status: Against Medical Advice | Payer: BLUE CROSS/BLUE SHIELD | Attending: Emergency Medicine | Admitting: Emergency Medicine

## 2017-12-18 ENCOUNTER — Other Ambulatory Visit: Payer: Self-pay

## 2017-12-18 DIAGNOSIS — T7840XA Allergy, unspecified, initial encounter: Secondary | ICD-10-CM

## 2017-12-18 DIAGNOSIS — L298 Other pruritus: Secondary | ICD-10-CM

## 2017-12-18 DIAGNOSIS — L299 Pruritus, unspecified: Secondary | ICD-10-CM

## 2017-12-18 DIAGNOSIS — R220 Localized swelling, mass and lump, head: Secondary | ICD-10-CM | POA: Diagnosis not present

## 2017-12-18 HISTORY — DX: Scoliosis, unspecified: M41.9

## 2017-12-18 MED ORDER — PREDNISONE 10 MG (48) PO TBPK: ORAL_TABLET | ORAL | 0 refills | Status: DC

## 2017-12-18 MED ORDER — METHYLPREDNISOLONE SODIUM SUCC 125 MG IJ SOLR
80.0000 mg | Freq: Once | INTRAMUSCULAR | Status: AC
  Administered 2017-12-18: 80 mg via INTRAMUSCULAR

## 2017-12-18 MED ORDER — METHYLPREDNISOLONE SODIUM SUCC 125 MG IJ SOLR
INTRAMUSCULAR | Status: AC
  Filled 2017-12-18: qty 2

## 2017-12-18 NOTE — ED Provider Notes (Signed)
  [facility]   [id number] 12/18/17 Arrival Time: 1032  ASSESSMENT & PLAN:  1. Allergic reaction, initial encounter   2. Itching   3. Facial swelling     Meds ordered this encounter  Medications  . predniSONE (STERAPRED UNI-PAK 48 TAB) 10 MG (48) TBPK tablet    Sig: Take as directed.    Dispense:  48 tablet    Refill:  0  . methylPREDNISolone sodium succinate (SOLU-MEDROL) 125 mg/2 mL injection 80 mg   Return precautions/instructions given. Will return in 24 hours if not seeing significant improvement. See AVS. Benadryl if needed.  Reviewed expectations re: course of current medical issues. Questions answered. Outlined signs and symptoms indicating need for more acute intervention. Patient verbalized understanding. After Visit Summary given.  SUBJECTIVE: History from: patient.  Christina SawyersMonisha Reese is a 22 y.o. female who presents with complaint of persistent facial swelling and itching. Triggers: questions related to hair dye used 2 days ago. Onset abrupt, approximately 2 days ago after using hair dye. Describes skin irritation around scalp, some on face. Itching worsening today. No SOB or swallowing difficulties. "Tongue feels a little fat." Normal PO intake. No n/v. No CP. No h/o similar reaction. Benadryl with minimal and transient help.  Social History   Tobacco Use  Smoking Status Current Every Day Smoker  Smokeless Tobacco Never Used   ROS: As per HPI. All other systems negative.   OBJECTIVE:  Vitals:   12/18/17 1043  BP: 125/76  Pulse: 100  Resp: 20  Temp: 98.4 F (36.9 C)  TempSrc: Oral  SpO2: 100%    General appearance: alert HEENT: nares patent; oropharynx without erythema; tongue normal; EOMI; bilateral conjunctiva normal Neck: supple without LAD CV: RRR Lungs: unlabored respirations, no wheezing; cough: absent; no significant respiratory distress Abd: soft and non-tender Skin: warm and dry; irritation/erythema around scalp with some  tenderness; R upper eyelid swollen Ext: no edema Psychological: alert and cooperative; normal mood and affect   Allergies  Allergen Reactions  . Latex   . Sulfa Antibiotics     Past Medical History:  Diagnosis Date  . Scoliosis    Family History  Problem Relation Age of Onset  . Healthy Mother    Social History   Socioeconomic History  . Marital status: Single    Spouse name: Not on file  . Number of children: Not on file  . Years of education: Not on file  . Highest education level: Not on file  Social Needs  . Financial resource strain: Not on file  . Food insecurity - worry: Not on file  . Food insecurity - inability: Not on file  . Transportation needs - medical: Not on file  . Transportation needs - non-medical: Not on file  Occupational History  . Not on file  Tobacco Use  . Smoking status: Current Every Day Smoker  . Smokeless tobacco: Never Used  Substance and Sexual Activity  . Alcohol use: Yes  . Drug use: No  . Sexual activity: Not on file  Other Topics Concern  . Not on file  Social History Narrative  . Not on file        Mardella LaymanHagler, Janilah Hojnacki, MD 12/18/17 1102

## 2017-12-18 NOTE — ED Notes (Signed)
No answer for vitals recheck

## 2017-12-18 NOTE — ED Notes (Signed)
Called pt name x 3 to recheck vitals. No answer 

## 2017-12-18 NOTE — ED Triage Notes (Signed)
Patient says started with hives on Monday, increasing hives on Tuesday, involving arms.  Patient has right eye swelling and facial swelling today.  Reports tongue is swelling today.  Patient thinks this is all brought on by hair dye used Sunday.  Patient says vision in right eye is swollen.  Patient is breathing without difficulty.  Patient has been taking benadryl

## 2017-12-18 NOTE — Discharge Instructions (Signed)
Please return here or to the Emergency Department immediately should you feel worse in any way or have any of the following symptoms: chest pain, shortness of breath, or nausea and vomiting. Please follow up for a recheck in 24 hours if you are not seeing significant improvement.

## 2017-12-22 DIAGNOSIS — R102 Pelvic and perineal pain: Secondary | ICD-10-CM | POA: Diagnosis not present

## 2017-12-23 DIAGNOSIS — Z6837 Body mass index (BMI) 37.0-37.9, adult: Secondary | ICD-10-CM | POA: Diagnosis not present

## 2017-12-23 DIAGNOSIS — H9191 Unspecified hearing loss, right ear: Secondary | ICD-10-CM | POA: Diagnosis not present

## 2017-12-23 DIAGNOSIS — B001 Herpesviral vesicular dermatitis: Secondary | ICD-10-CM | POA: Diagnosis not present

## 2017-12-23 DIAGNOSIS — H538 Other visual disturbances: Secondary | ICD-10-CM | POA: Diagnosis not present

## 2018-02-04 ENCOUNTER — Inpatient Hospital Stay (HOSPITAL_COMMUNITY)
Admission: AD | Admit: 2018-02-04 | Discharge: 2018-02-04 | Disposition: A | Discharge status: Home / Self Care | Payer: BLUE CROSS/BLUE SHIELD | Source: Ambulatory Visit | Attending: Obstetrics & Gynecology | Admitting: Obstetrics & Gynecology

## 2018-02-04 ENCOUNTER — Encounter (HOSPITAL_COMMUNITY): Payer: Self-pay

## 2018-02-04 DIAGNOSIS — B9689 Other specified bacterial agents as the cause of diseases classified elsewhere: Secondary | ICD-10-CM | POA: Diagnosis not present

## 2018-02-04 DIAGNOSIS — R102 Pelvic and perineal pain: Secondary | ICD-10-CM | POA: Insufficient documentation

## 2018-02-04 DIAGNOSIS — F17200 Nicotine dependence, unspecified, uncomplicated: Secondary | ICD-10-CM | POA: Insufficient documentation

## 2018-02-04 DIAGNOSIS — N760 Acute vaginitis: Secondary | ICD-10-CM | POA: Insufficient documentation

## 2018-02-04 DIAGNOSIS — R109 Unspecified abdominal pain: Secondary | ICD-10-CM | POA: Diagnosis not present

## 2018-02-04 HISTORY — DX: Major depressive disorder, single episode, unspecified: F32.9

## 2018-02-04 HISTORY — DX: Depression, unspecified: F32.A

## 2018-02-04 LAB — URINALYSIS, ROUTINE W REFLEX MICROSCOPIC
Bilirubin Urine: NEGATIVE
Glucose, UA: NEGATIVE mg/dL
Ketones, ur: NEGATIVE mg/dL
Nitrite: NEGATIVE
Protein, ur: NEGATIVE mg/dL
Specific Gravity, Urine: 1.024 (ref 1.005–1.030)
pH: 5 (ref 5.0–8.0)

## 2018-02-04 LAB — COMPREHENSIVE METABOLIC PANEL
ALT: 28 U/L (ref 14–54)
AST: 30 U/L (ref 15–41)
Albumin: 4 g/dL (ref 3.5–5.0)
Alkaline Phosphatase: 50 U/L (ref 38–126)
Anion gap: 10 (ref 5–15)
BUN: 10 mg/dL (ref 6–20)
CO2: 19 mmol/L — ABNORMAL LOW (ref 22–32)
Calcium: 9.1 mg/dL (ref 8.9–10.3)
Chloride: 108 mmol/L (ref 101–111)
Creatinine, Ser: 0.77 mg/dL (ref 0.44–1.00)
GFR calc Af Amer: >60 mL/min (ref >60)
GFR calc non Af Amer: >60 mL/min (ref >60)
Glucose, Bld: 85 mg/dL (ref 65–99)
Potassium: 3.7 mmol/L (ref 3.5–5.1)
Sodium: 137 mmol/L (ref 135–145)
Total Bilirubin: 0.5 mg/dL (ref 0.3–1.2)
Total Protein: 7.4 g/dL (ref 6.5–8.1)

## 2018-02-04 LAB — WET PREP, GENITAL
Sperm: NONE SEEN
Trich, Wet Prep: NONE SEEN
WBC, Wet Prep HPF POC: NONE SEEN
Yeast Wet Prep HPF POC: NONE SEEN

## 2018-02-04 LAB — POCT PREGNANCY, URINE: Preg Test, Ur: NEGATIVE

## 2018-02-04 MED ORDER — KETOROLAC TROMETHAMINE 60 MG/2ML IM SOLN
60.0000 mg | INTRAMUSCULAR | Status: AC
  Administered 2018-02-04: 60 mg via INTRAMUSCULAR
  Filled 2018-02-04: qty 2

## 2018-02-04 MED ORDER — TRAMADOL HCL 50 MG PO TABS: 50.0000 mg | ORAL_TABLET | Freq: Four times a day (QID) | ORAL | 0 refills | Status: DC | PRN

## 2018-02-04 MED ORDER — METRONIDAZOLE 500 MG PO TABS: 500.0000 mg | ORAL_TABLET | Freq: Two times a day (BID) | ORAL | 0 refills | Status: AC

## 2018-02-04 MED ORDER — IBUPROFEN 600 MG PO TABS: 600.0000 mg | ORAL_TABLET | Freq: Four times a day (QID) | ORAL | 0 refills | Status: DC | PRN

## 2018-02-04 NOTE — MAU Note (Signed)
Pelvic and abdominal pain started again last. Rating pain 7/10.

## 2018-02-04 NOTE — MAU Note (Signed)
Pt reports lower abdominal pain and sharp pelvic pain. States she had this pain before and was told she had a cyst. States she is being followed by GYN. States last night she started having really bad sharp pain. States it is intermittent. States she has not taken anything for pain. Denies bleeding or discharge.

## 2018-02-04 NOTE — MAU Provider Note (Signed)
History     CSN: [id number]  Arrival date and time: 02/04/18 2001   First Provider Initiated Contact with Patient 02/04/18 2042      Chief Complaint  Patient presents with  . Abdominal Pain  . Pelvic Pain   HPI  Ms.  Christina Reese is a 22 y.o. year old [id number] non-pregnant female who presents to MAU reporting RT lower pelvic pain that is off and on since December, but returned last night. She describes the pain as "sharp". She was dx'd with an ovarian cyst by CT scan in 12/2017. She reports being dx'd in the past with an ectopic pregnancy, but once in surgery, they found what was on her ovary to be a cyst and not an ectopic. She later miscarried that same pregnancy. She has not taken anything for the pain today. She receives her GYN care with [facility].  Past Medical History:  Diagnosis Date  . Depression   . Scoliosis     Past Surgical History:  Procedure Laterality Date  . ABDOMINAL SURGERY      Family History  Problem Relation Age of Onset  . Healthy Mother     Social History   Tobacco Use  . Smoking status: Current Every Day Smoker  . Smokeless tobacco: Never Used  Substance Use Topics  . Alcohol use: Yes  . Drug use: No    Allergies:  Allergies  Allergen Reactions  . Latex   . Sulfa Antibiotics     Medications Prior to Admission  Medication Sig Dispense Refill Last Dose  . ibuprofen (ADVIL,MOTRIN) 800 MG tablet Take 800 mg by mouth every 8 (eight) hours as needed for headache, mild pain, moderate pain or cramping.   02/03/2018 at Unknown time  . NON FORMULARY    Past Month at Unknown time  . acetaminophen (TYLENOL) 500 MG tablet Take 1,000 mg by mouth every 6 (six) hours as needed for mild pain, moderate pain or headache.   Unknown at Unknown time  . diphenhydrAMINE (BENADRYL) 25 mg capsule Take 25 mg by mouth every 6 (six) hours as needed.   Unknown at Unknown time  . naproxen (NAPROSYN) 500 MG tablet Take 1 po BID with food prn pain 30 tablet 0 Unknown at  Unknown time  . predniSONE (STERAPRED UNI-PAK 48 TAB) 10 MG (48) TBPK tablet Take as directed. 48 tablet 0 Unknown at Unknown time  . Prenatal Vit-Fe Fumarate-FA (PNV PRENATAL PLUS MULTIVITAMIN) 27-1 MG TABS Take 1 tablet by mouth daily.  3 Unknown at Unknown time    Review of Systems  Constitutional: Negative.   HENT: Negative.   Eyes: Negative.   Respiratory: Negative.   Cardiovascular: Negative.   Gastrointestinal: Negative.   Endocrine: Negative.   Genitourinary: Positive for pelvic pain (RT lower; "sharp"). Negative for vaginal bleeding and vaginal discharge.  Musculoskeletal: Negative.   Skin: Negative.   Allergic/Immunologic: Negative.   Neurological: Negative.   Hematological: Negative.   Psychiatric/Behavioral: Negative.    Physical Exam   Blood pressure 121/84, pulse 72, temperature 98.4 F (36.9 C), temperature source Oral, resp. rate 20, height 5\' 3"  (1.6 m), weight 221 lb (100.2 kg), last menstrual period 12/27/2017, SpO2 100 %.  Physical Exam  Nursing note and vitals reviewed. Constitutional: She is oriented to person, place, and time. She appears well-developed and well-nourished.  HENT:  Head: Normocephalic and atraumatic.  Eyes: Pupils are equal, round, and reactive to light.  Neck: Normal range of motion.  Cardiovascular: Normal rate, regular rhythm and  normal heart sounds.  Respiratory: Effort normal and breath sounds normal.  GI: Soft. Bowel sounds are normal.  Genitourinary:  Genitourinary Comments: Uterus: non-tender, SE: cervix is smooth, pink, no lesions, small amt of mucoid blood d/c coming from cervical os, scant amt of thick, white vaginal d/c -- WP, GC/CT done, closed/long/firm, no CMT or friability, no adnexal tenderness   Musculoskeletal: Normal range of motion.  Neurological: She is alert and oriented to person, place, and time.  Skin: Skin is warm and dry.  Psychiatric: She has a normal mood and affect. Her behavior is normal. Judgment and  thought content normal.    MAU Course  Procedures  MDM CCUA UPT CBC w/Diff Wet Prep GC/CT -- pending HIV -- pending RPR -- pending Toradol 60 mg IM -- pain improved from 7/10 to 2/10  *Consult with Dr. Langston Masker @ 2140 - notified of patient's complaints, assessments, & lab results, tx plan d/c home with Rx for Flagyl, Ultram and Ibuprofen, advised to f/u with [facility] if pelvic pain persists - ok to d/c home, agrees with plan   Results for orders placed or performed during the hospital encounter of 02/04/18 (from the past 24 hour(s))  Urinalysis, Routine w reflex microscopic     Status: Abnormal   Collection Time: 02/04/18  8:08 PM  Result Value Ref Range   Color, Urine YELLOW YELLOW   APPearance HAZY (A) CLEAR   Specific Gravity, Urine 1.024 1.005 - 1.030   pH 5.0 5.0 - 8.0   Glucose, UA NEGATIVE NEGATIVE mg/dL   Hgb urine dipstick SMALL (A) NEGATIVE   Bilirubin Urine NEGATIVE NEGATIVE   Ketones, ur NEGATIVE NEGATIVE mg/dL   Protein, ur NEGATIVE NEGATIVE mg/dL   Nitrite NEGATIVE NEGATIVE   Leukocytes, UA TRACE (A) NEGATIVE   RBC / HPF 0-5 0 - 5 RBC/hpf   WBC, UA 0-5 0 - 5 WBC/hpf   Bacteria, UA RARE (A) NONE SEEN   Squamous Epithelial / LPF 6-30 (A) NONE SEEN   Mucus PRESENT   Pregnancy, urine POC     Status: None   Collection Time: 02/04/18  8:28 PM  Result Value Ref Range   Preg Test, Ur NEGATIVE NEGATIVE  Wet prep, genital     Status: Abnormal   Collection Time: 02/04/18  8:58 PM  Result Value Ref Range   Yeast Wet Prep HPF POC NONE SEEN NONE SEEN   Trich, Wet Prep NONE SEEN NONE SEEN   Clue Cells Wet Prep HPF POC PRESENT (A) NONE SEEN   WBC, Wet Prep HPF POC NONE SEEN NONE SEEN   Sperm NONE SEEN     Assessment and Plan  Pelvic pain - Rx for Ultram 50 mg every 6 hrs prn pain - Rx for Ibuprofen 600 mg every 6 hrs prn pain - Information provided on pelvic pain   Bacterial vaginitis  - Rx for Flagyl 500 mg BID x 7 days - Information provided on BV & Flagyl   -  Discharge patient - F/U with [facility], if pelvic pain persists  - Patient verbalized an understanding of the plan of care and agrees.     Raelyn Mora, MSN, CNM 02/04/2018, 8:59 PM

## 2018-02-04 NOTE — Discharge Instructions (Signed)
[facility]  ° ° °[facility]       Phone: [phone] ° °[facility] Phone: [phone] ° °[facility]   Phone: [phone] ° °[facility] [place] [facility]  Phone: [phone] ° °[facility]  Phone: [phone] ° °[facility]       Phone: [phone] ° °[facility]    Phone: [phone]  ° °[facility] ([place]    Phone: [phone] ° °[facility]    Phone: [phone] ° °[facility]    Phone: [phone] ° °[facility]    Phone: [phone] ° °[facility]       Phone: [phone]  ° °[facility]  Phone: [phone] ° °[facility]    Phone: [phone] ° °[facility]   Phone: [phone] ° °[facility]      Phone: [phone] ° °[facility]    Phone: [phone] ° °

## 2018-02-05 LAB — GC/CHLAMYDIA PROBE AMP (~~LOC~~) NOT AT ARMC
Chlamydia: NEGATIVE
Neisseria Gonorrhea: NEGATIVE

## 2018-02-05 LAB — HIV ANTIBODY (ROUTINE TESTING W REFLEX): HIV Screen 4th Generation wRfx: NONREACTIVE

## 2018-04-01 DIAGNOSIS — N938 Other specified abnormal uterine and vaginal bleeding: Secondary | ICD-10-CM | POA: Diagnosis not present

## 2018-04-01 DIAGNOSIS — D509 Iron deficiency anemia, unspecified: Secondary | ICD-10-CM | POA: Diagnosis not present

## 2018-04-01 DIAGNOSIS — R1031 Right lower quadrant pain: Secondary | ICD-10-CM | POA: Diagnosis not present

## 2018-04-01 DIAGNOSIS — Z3202 Encounter for pregnancy test, result negative: Secondary | ICD-10-CM | POA: Diagnosis not present

## 2018-06-18 DIAGNOSIS — M9902 Segmental and somatic dysfunction of thoracic region: Secondary | ICD-10-CM | POA: Diagnosis not present

## 2018-06-18 DIAGNOSIS — M9903 Segmental and somatic dysfunction of lumbar region: Secondary | ICD-10-CM | POA: Diagnosis not present

## 2018-06-18 DIAGNOSIS — M4125 Other idiopathic scoliosis, thoracolumbar region: Secondary | ICD-10-CM | POA: Diagnosis not present

## 2018-06-18 DIAGNOSIS — M5137 Other intervertebral disc degeneration, lumbosacral region: Secondary | ICD-10-CM | POA: Diagnosis not present

## 2018-10-21 DIAGNOSIS — M9903 Segmental and somatic dysfunction of lumbar region: Secondary | ICD-10-CM | POA: Diagnosis not present

## 2018-10-21 DIAGNOSIS — M4125 Other idiopathic scoliosis, thoracolumbar region: Secondary | ICD-10-CM | POA: Diagnosis not present

## 2018-10-21 DIAGNOSIS — M9902 Segmental and somatic dysfunction of thoracic region: Secondary | ICD-10-CM | POA: Diagnosis not present

## 2018-10-21 DIAGNOSIS — M5137 Other intervertebral disc degeneration, lumbosacral region: Secondary | ICD-10-CM | POA: Diagnosis not present

## 2018-10-26 DIAGNOSIS — M4125 Other idiopathic scoliosis, thoracolumbar region: Secondary | ICD-10-CM | POA: Diagnosis not present

## 2018-10-26 DIAGNOSIS — M9903 Segmental and somatic dysfunction of lumbar region: Secondary | ICD-10-CM | POA: Diagnosis not present

## 2018-10-26 DIAGNOSIS — M5137 Other intervertebral disc degeneration, lumbosacral region: Secondary | ICD-10-CM | POA: Diagnosis not present

## 2018-10-26 DIAGNOSIS — M9902 Segmental and somatic dysfunction of thoracic region: Secondary | ICD-10-CM | POA: Diagnosis not present

## 2018-10-29 DIAGNOSIS — M9902 Segmental and somatic dysfunction of thoracic region: Secondary | ICD-10-CM | POA: Diagnosis not present

## 2018-10-29 DIAGNOSIS — M4125 Other idiopathic scoliosis, thoracolumbar region: Secondary | ICD-10-CM | POA: Diagnosis not present

## 2018-10-29 DIAGNOSIS — M9903 Segmental and somatic dysfunction of lumbar region: Secondary | ICD-10-CM | POA: Diagnosis not present

## 2018-10-29 DIAGNOSIS — M5137 Other intervertebral disc degeneration, lumbosacral region: Secondary | ICD-10-CM | POA: Diagnosis not present

## 2018-11-02 DIAGNOSIS — M9903 Segmental and somatic dysfunction of lumbar region: Secondary | ICD-10-CM | POA: Diagnosis not present

## 2018-11-02 DIAGNOSIS — M9902 Segmental and somatic dysfunction of thoracic region: Secondary | ICD-10-CM | POA: Diagnosis not present

## 2018-11-02 DIAGNOSIS — M5137 Other intervertebral disc degeneration, lumbosacral region: Secondary | ICD-10-CM | POA: Diagnosis not present

## 2018-11-02 DIAGNOSIS — M4125 Other idiopathic scoliosis, thoracolumbar region: Secondary | ICD-10-CM | POA: Diagnosis not present

## 2018-11-05 DIAGNOSIS — M9902 Segmental and somatic dysfunction of thoracic region: Secondary | ICD-10-CM | POA: Diagnosis not present

## 2018-11-05 DIAGNOSIS — M4125 Other idiopathic scoliosis, thoracolumbar region: Secondary | ICD-10-CM | POA: Diagnosis not present

## 2018-11-05 DIAGNOSIS — M9903 Segmental and somatic dysfunction of lumbar region: Secondary | ICD-10-CM | POA: Diagnosis not present

## 2018-11-05 DIAGNOSIS — M5137 Other intervertebral disc degeneration, lumbosacral region: Secondary | ICD-10-CM | POA: Diagnosis not present

## 2018-11-12 DIAGNOSIS — M9903 Segmental and somatic dysfunction of lumbar region: Secondary | ICD-10-CM | POA: Diagnosis not present

## 2018-11-12 DIAGNOSIS — M4125 Other idiopathic scoliosis, thoracolumbar region: Secondary | ICD-10-CM | POA: Diagnosis not present

## 2018-11-12 DIAGNOSIS — M5137 Other intervertebral disc degeneration, lumbosacral region: Secondary | ICD-10-CM | POA: Diagnosis not present

## 2018-11-12 DIAGNOSIS — M9902 Segmental and somatic dysfunction of thoracic region: Secondary | ICD-10-CM | POA: Diagnosis not present

## 2018-11-23 DIAGNOSIS — J069 Acute upper respiratory infection, unspecified: Secondary | ICD-10-CM | POA: Diagnosis not present

## 2018-12-17 ENCOUNTER — Encounter (HOSPITAL_COMMUNITY): Payer: Self-pay | Admitting: Emergency Medicine

## 2018-12-17 ENCOUNTER — Other Ambulatory Visit: Payer: Self-pay

## 2018-12-17 ENCOUNTER — Ambulatory Visit (HOSPITAL_COMMUNITY)
Admission: EM | Admit: 2018-12-17 | Discharge: 2018-12-17 | Disposition: A | Discharge status: Home / Self Care | Payer: BLUE CROSS/BLUE SHIELD | Attending: Family Medicine | Admitting: Family Medicine

## 2018-12-17 DIAGNOSIS — K122 Cellulitis and abscess of mouth: Secondary | ICD-10-CM | POA: Diagnosis not present

## 2018-12-17 MED ORDER — PREDNISONE 10 MG (21) PO TBPK: ORAL_TABLET | Freq: Every day | ORAL | 0 refills | Status: DC

## 2018-12-17 NOTE — ED Provider Notes (Signed)
[facility]   [id number] 12/17/18 Arrival Time: 1008  ASSESSMENT & PLAN:  1. Uvulitis    Likely viral trigger. Discussed.  Meds ordered this encounter  Medications  . predniSONE (STERAPRED UNI-PAK 21 TAB) 10 MG (21) TBPK tablet    Sig: Take by mouth daily. Take as directed.    Dispense:  21 tablet    Refill:  0   Follow-up Information    Richardean Chimeraaniel, Terry G, MD.   Specialty:  Family Medicine Why:  As needed. Contact information: [address] [place] [address] [phone]        [facility]   Specialty:  Urgent Care Why:  If symptoms worsen. Contact information: [address] [place] [id number] [phone]         Reviewed expectations re: course of current medical issues. Questions answered. Outlined signs and symptoms indicating need for more acute intervention. Patient verbalized understanding. After Visit Summary given.   SUBJECTIVE:  Christina SawyersMonisha Reese is a 23 y.o. female who reports mild nasal congestion and dry cough for a couple of days. No specific sore throat. Now new medications. Today feels like she is gagging. No n/v. Describes as "swelling of the thing that hangs down in the back of your mouth". No respiratory symptoms. Normal PO intake but reports discomfort with swallowing. Fever reported: no. No neck pain or swelling. No associated n/v/abdominal symptoms. Sick contacts: none known.  OTC treatment: none.  ROS: As per HPI.  OBJECTIVE:  Vitals:   12/17/18 1106  BP: 110/75  Pulse: 94  Resp: 18  Temp: (!) 97.2 F (36.2 C)  TempSrc: Temporal  SpO2: 98%    General appearance: alert; no distress HEENT: throat with mild erythema; uvula midline: yes but swollen and elongated, resting on her tongue Neck: supple with FROM; no lymphadenopathy CV: RRR Lungs: clear to auscultation bilaterally Abd: soft; non-tender Skin: reveals no rash; warm and dry Psychological: alert and cooperative;  normal mood and affect  Allergies  Allergen Reactions  . Latex   . Sulfa Antibiotics     Past Medical History:  Diagnosis Date  . Depression   . Scoliosis    Social History   Socioeconomic History  . Marital status: Single    Spouse name: Not on file  . Number of children: Not on file  . Years of education: Not on file  . Highest education level: Not on file  Occupational History  . Not on file  Social Needs  . Financial resource strain: Not on file  . Food insecurity:    Worry: Not on file    Inability: Not on file  . Transportation needs:    Medical: Not on file    Non-medical: Not on file  Tobacco Use  . Smoking status: Current Every Day Smoker  . Smokeless tobacco: Never Used  Substance and Sexual Activity  . Alcohol use: Yes  . Drug use: No  . Sexual activity: Not on file  Lifestyle  . Physical activity:    Days per week: Not on file    Minutes per session: Not on file  . Stress: Not on file  Relationships  . Social connections:    Talks on phone: Not on file    Gets together: Not on file    Attends religious service: Not on file    Active member of club or organization: Not on file    Attends meetings of clubs or organizations: Not on  file    Relationship status: Not on file  . Intimate partner violence:    Fear of current or ex partner: Not on file    Emotionally abused: Not on file    Physically abused: Not on file    Forced sexual activity: Not on file  Other Topics Concern  . Not on file  Social History Narrative  . Not on file   Family History  Problem Relation Age of Onset  . Healthy Mother           Mardella Layman, MD 12/17/18 1125

## 2018-12-17 NOTE — ED Triage Notes (Signed)
Patient is having painful swallowing and sore throat.  Patient has tried warm solt water gargles, but no improvement.  Patient reports throat was sore yesterday.  No known fever

## 2018-12-29 ENCOUNTER — Encounter (HOSPITAL_COMMUNITY): Payer: Self-pay

## 2018-12-29 ENCOUNTER — Encounter (HOSPITAL_COMMUNITY): Payer: Self-pay | Admitting: *Deleted

## 2018-12-29 ENCOUNTER — Inpatient Hospital Stay (HOSPITAL_COMMUNITY)
Admission: AD | Admit: 2018-12-29 | Discharge: 2019-01-03 | DRG: 885 | Disposition: A | Discharge status: Home / Self Care | Payer: BLUE CROSS/BLUE SHIELD | Source: Intra-hospital | Attending: Psychiatry | Admitting: Psychiatry

## 2018-12-29 ENCOUNTER — Emergency Department (HOSPITAL_COMMUNITY)
Admission: EM | Admit: 2018-12-29 | Discharge: 2018-12-29 | Disposition: A | Discharge status: Other Acute Inpatient Hospital | Payer: BLUE CROSS/BLUE SHIELD | Source: Home / Self Care | Attending: Emergency Medicine | Admitting: Emergency Medicine

## 2018-12-29 ENCOUNTER — Emergency Department (HOSPITAL_COMMUNITY): Payer: BLUE CROSS/BLUE SHIELD

## 2018-12-29 ENCOUNTER — Other Ambulatory Visit: Payer: Self-pay

## 2018-12-29 DIAGNOSIS — Z79899 Other long term (current) drug therapy: Secondary | ICD-10-CM

## 2018-12-29 DIAGNOSIS — Z008 Encounter for other general examination: Secondary | ICD-10-CM | POA: Insufficient documentation

## 2018-12-29 DIAGNOSIS — F332 Major depressive disorder, recurrent severe without psychotic features: Secondary | ICD-10-CM

## 2018-12-29 DIAGNOSIS — R51 Headache: Secondary | ICD-10-CM

## 2018-12-29 DIAGNOSIS — E669 Obesity, unspecified: Secondary | ICD-10-CM

## 2018-12-29 DIAGNOSIS — M79641 Pain in right hand: Secondary | ICD-10-CM | POA: Insufficient documentation

## 2018-12-29 DIAGNOSIS — F17210 Nicotine dependence, cigarettes, uncomplicated: Secondary | ICD-10-CM | POA: Diagnosis present

## 2018-12-29 DIAGNOSIS — Z91040 Latex allergy status: Secondary | ICD-10-CM

## 2018-12-29 DIAGNOSIS — G4700 Insomnia, unspecified: Secondary | ICD-10-CM | POA: Diagnosis present

## 2018-12-29 DIAGNOSIS — R0689 Other abnormalities of breathing: Secondary | ICD-10-CM | POA: Diagnosis not present

## 2018-12-29 DIAGNOSIS — Z560 Unemployment, unspecified: Secondary | ICD-10-CM

## 2018-12-29 DIAGNOSIS — F17200 Nicotine dependence, unspecified, uncomplicated: Secondary | ICD-10-CM

## 2018-12-29 DIAGNOSIS — S6991XA Unspecified injury of right wrist, hand and finger(s), initial encounter: Secondary | ICD-10-CM | POA: Diagnosis not present

## 2018-12-29 DIAGNOSIS — H9201 Otalgia, right ear: Secondary | ICD-10-CM | POA: Insufficient documentation

## 2018-12-29 DIAGNOSIS — R42 Dizziness and giddiness: Secondary | ICD-10-CM | POA: Diagnosis not present

## 2018-12-29 DIAGNOSIS — F4310 Post-traumatic stress disorder, unspecified: Secondary | ICD-10-CM | POA: Diagnosis present

## 2018-12-29 DIAGNOSIS — Z882 Allergy status to sulfonamides status: Secondary | ICD-10-CM

## 2018-12-29 DIAGNOSIS — R45851 Suicidal ideations: Secondary | ICD-10-CM | POA: Insufficient documentation

## 2018-12-29 DIAGNOSIS — M5489 Other dorsalgia: Secondary | ICD-10-CM | POA: Diagnosis not present

## 2018-12-29 DIAGNOSIS — Z9114 Patient's other noncompliance with medication regimen: Secondary | ICD-10-CM

## 2018-12-29 DIAGNOSIS — F410 Panic disorder [episodic paroxysmal anxiety] without agoraphobia: Secondary | ICD-10-CM | POA: Diagnosis present

## 2018-12-29 DIAGNOSIS — R064 Hyperventilation: Secondary | ICD-10-CM | POA: Diagnosis not present

## 2018-12-29 LAB — CBC WITH DIFFERENTIAL/PLATELET
Abs Immature Granulocytes: 0.03 10*3/uL (ref 0.00–0.07)
Basophils Absolute: 0 10*3/uL (ref 0.0–0.1)
Basophils Relative: 0 %
Eosinophils Absolute: 0 10*3/uL (ref 0.0–0.5)
Eosinophils Relative: 0 %
HCT: 42.6 % (ref 36.0–46.0)
Hemoglobin: 13.9 g/dL (ref 12.0–15.0)
Immature Granulocytes: 0 %
Lymphocytes Relative: 27 %
Lymphs Abs: 2.7 10*3/uL (ref 0.7–4.0)
MCH: 28.6 pg (ref 26.0–34.0)
MCHC: 32.6 g/dL (ref 30.0–36.0)
MCV: 87.7 fL (ref 80.0–100.0)
Monocytes Absolute: 0.7 10*3/uL (ref 0.1–1.0)
Monocytes Relative: 7 %
Neutro Abs: 6.4 10*3/uL (ref 1.7–7.7)
Neutrophils Relative %: 66 %
Platelets: 317 10*3/uL (ref 150–400)
RBC: 4.86 MIL/uL (ref 3.87–5.11)
RDW: 13.2 % (ref 11.5–15.5)
WBC: 9.9 10*3/uL (ref 4.0–10.5)
nRBC: 0 % (ref 0.0–0.2)

## 2018-12-29 LAB — COMPREHENSIVE METABOLIC PANEL
ALT: 18 U/L (ref 0–44)
AST: 20 U/L (ref 15–41)
Albumin: 4 g/dL (ref 3.5–5.0)
Alkaline Phosphatase: 48 U/L (ref 38–126)
Anion gap: 8 (ref 5–15)
BUN: 8 mg/dL (ref 6–20)
CO2: 24 mmol/L (ref 22–32)
Calcium: 9.4 mg/dL (ref 8.9–10.3)
Chloride: 109 mmol/L (ref 98–111)
Creatinine, Ser: 0.9 mg/dL (ref 0.44–1.00)
GFR calc Af Amer: >60 mL/min (ref >60)
GFR calc non Af Amer: >60 mL/min (ref >60)
Glucose, Bld: 97 mg/dL (ref 70–99)
Potassium: 3.9 mmol/L (ref 3.5–5.1)
Sodium: 141 mmol/L (ref 135–145)
Total Bilirubin: 0.6 mg/dL (ref 0.3–1.2)
Total Protein: 7.2 g/dL (ref 6.5–8.1)

## 2018-12-29 LAB — RAPID URINE DRUG SCREEN, HOSP PERFORMED
Amphetamines: NOT DETECTED
Barbiturates: NOT DETECTED
Benzodiazepines: NOT DETECTED
Cocaine: NOT DETECTED
Opiates: NOT DETECTED
Tetrahydrocannabinol: NOT DETECTED

## 2018-12-29 LAB — SALICYLATE LEVEL: Salicylate Lvl: <7 mg/dL (ref 2.8–30.0)

## 2018-12-29 LAB — PREGNANCY, URINE: Preg Test, Ur: NEGATIVE

## 2018-12-29 LAB — ETHANOL: Alcohol, Ethyl (B): <10 mg/dL (ref <10)

## 2018-12-29 MED ORDER — ACETAMINOPHEN 325 MG PO TABS
650.0000 mg | ORAL_TABLET | Freq: Once | ORAL | Status: DC
  Filled 2018-12-29: qty 2

## 2018-12-29 MED ORDER — ZOLPIDEM TARTRATE 5 MG PO TABS: 5.0000 mg | ORAL_TABLET | Freq: Every evening | ORAL | Status: DC | PRN

## 2018-12-29 MED ORDER — ONDANSETRON HCL 4 MG PO TABS: 4.0000 mg | ORAL_TABLET | Freq: Three times a day (TID) | ORAL | Status: DC | PRN

## 2018-12-29 MED ORDER — NICOTINE 21 MG/24HR TD PT24
21.0000 mg | MEDICATED_PATCH | Freq: Every day | TRANSDERMAL | Status: DC
  Filled 2018-12-29 (×7): qty 1

## 2018-12-29 MED ORDER — ACETAMINOPHEN 325 MG PO TABS
650.0000 mg | ORAL_TABLET | Freq: Four times a day (QID) | ORAL | Status: DC | PRN
  Administered 2018-12-30 (×2): 650 mg via ORAL
  Filled 2018-12-29 (×2): qty 2

## 2018-12-29 MED ORDER — ALUM & MAG HYDROXIDE-SIMETH 200-200-20 MG/5ML PO SUSP: 30.0000 mL | Freq: Four times a day (QID) | ORAL | Status: DC | PRN

## 2018-12-29 MED ORDER — ACETAMINOPHEN 325 MG PO TABS
650.0000 mg | ORAL_TABLET | Freq: Once | ORAL | Status: AC
  Administered 2018-12-29: 650 mg via ORAL
  Filled 2018-12-29: qty 2

## 2018-12-29 MED ORDER — NICOTINE 21 MG/24HR TD PT24: 21.0000 mg | MEDICATED_PATCH | Freq: Every day | TRANSDERMAL | Status: DC

## 2018-12-29 MED ORDER — IBUPROFEN 200 MG PO TABS: 600.0000 mg | ORAL_TABLET | Freq: Three times a day (TID) | ORAL | Status: DC | PRN

## 2018-12-29 MED ORDER — ALUM & MAG HYDROXIDE-SIMETH 200-200-20 MG/5ML PO SUSP
30.0000 mL | ORAL | Status: DC | PRN
  Administered 2018-12-31 – 2019-01-02 (×2): 30 mL via ORAL
  Filled 2018-12-29 (×2): qty 30

## 2018-12-29 MED ORDER — MAGNESIUM HYDROXIDE 400 MG/5ML PO SUSP: 30.0000 mL | Freq: Every day | ORAL | Status: DC | PRN

## 2018-12-29 NOTE — BH Assessment (Signed)
[facility] Assessment Progress Note  Per Juanetta Beets, DO, this pt requires psychiatric hospitalization at this time.  Lamount Cranker, RN, [facility] has assigned pt to [facility] Rm [id number]; [facility] will be ready to receive pt at 15:30.  Pt has signed Voluntary Admission and Consent for Treatment, as well as Consent to Release Information to pt's mother, and signed forms have been faxed to [facility].  Pt's nurse, Kendal Hymen, has been notified, and agrees to send original paperwork along with pt via Juel Burrow, and to call report to [phone].  Doylene Canning, [place] [facility] Coordinator [phone]

## 2018-12-29 NOTE — ED Notes (Signed)
Report given and [facility] transportation called.  Transportation stated that it will be close to an hour before they can get here.

## 2018-12-29 NOTE — Progress Notes (Signed)
Patient did not attend wrap up group. 

## 2018-12-29 NOTE — ED Notes (Signed)
Pt discharged safely with [facility] driver.  All belongings were sent with patient. 
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
## 2018-12-29 NOTE — ED Notes (Signed)
Pt oriented to room and unit.  Pt is very tearful.  Pt was offered lunch but stated she cannot eat so I gave her a gingerale.  Pt contracts for safety at this time.  15 minute checks and video monitoring initiated.

## 2018-12-29 NOTE — ED Notes (Signed)
Bed: [facility] Expected date:  Expected time:  Means of arrival:  Comments: EMS anxiety attack

## 2018-12-29 NOTE — ED Notes (Signed)
Pt wanded by security. 
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
## 2018-12-29 NOTE — ED Notes (Signed)
[facility] is backed up with admissions so they will call when ready for report.
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
## 2018-12-29 NOTE — ED Notes (Addendum)
Pt's aunt and young cousin (less than 25 y/o) are in the waiting room, this RN updated her on pt's status on how she is (with pt's permission).  Pt is trying to get a friend to come to watch her cousin so her aunt can visit her in the ED.

## 2018-12-29 NOTE — ED Notes (Signed)
Bed: [facility] Expected date:  Expected time:  Means of arrival:  Comments: Hold for [employer]
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
## 2018-12-30 DIAGNOSIS — F332 Major depressive disorder, recurrent severe without psychotic features: Principal | ICD-10-CM

## 2018-12-30 LAB — LIPID PANEL
Cholesterol: 159 mg/dL (ref 0–200)
HDL: 39 mg/dL — ABNORMAL LOW (ref >40)
LDL Cholesterol: 106 mg/dL — ABNORMAL HIGH (ref 0–99)
Total CHOL/HDL Ratio: 4.1 RATIO
Triglycerides: 72 mg/dL (ref <150)
VLDL: 14 mg/dL (ref 0–40)

## 2018-12-30 LAB — TSH: TSH: 1.051 u[IU]/mL (ref 0.350–4.500)

## 2018-12-30 MED ORDER — CLONAZEPAM 0.5 MG PO TABS
0.5000 mg | ORAL_TABLET | Freq: Two times a day (BID) | ORAL | Status: AC
  Administered 2018-12-30 – 2018-12-31 (×4): 0.5 mg via ORAL
  Filled 2018-12-30 (×4): qty 1

## 2018-12-30 MED ORDER — PRENATAL MULTIVITAMIN CH
1.0000 | ORAL_TABLET | Freq: Every day | ORAL | Status: DC
  Administered 2018-12-30 – 2019-01-02 (×4): 1 via ORAL
  Filled 2018-12-30 (×6): qty 1

## 2018-12-30 MED ORDER — VORTIOXETINE HBR 10 MG PO TABS
10.0000 mg | ORAL_TABLET | Freq: Every day | ORAL | Status: DC
  Administered 2018-12-30 – 2019-01-03 (×4): 10 mg via ORAL
  Filled 2018-12-30 (×7): qty 1

## 2018-12-30 MED ORDER — TEMAZEPAM 15 MG PO CAPS
30.0000 mg | ORAL_CAPSULE | Freq: Every day | ORAL | Status: DC
  Administered 2018-12-31: 30 mg via ORAL
  Filled 2018-12-30 (×2): qty 2
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
## 2018-12-30 NOTE — [facility] Group Notes (Signed)
[facility] Group Notes:  Nursing Psychoeducational Group  Date:  12/30/2018  Time:  4:00 PM  Type of Therapy:  Psychoeducational Skills  Participation Level:  Minimal  Participation Quality:  Poor, Resistant  Affect:  Blunted  Cognitive:  Alert and Oriented  Insight:  Limited/Lacking  Engagement in Group:  Resistant  Modes of Intervention:  Discussion, Socialization and Support  Summary of Progress/Problems: Patient could not identify something that made her anxious but did attend group and listened. Patient did not participate in the discussion.   Marchelle Folks A Creola Krotz 12/30/2018, 5:30 PM

## 2018-12-30 NOTE — [facility] Suicide Risk Assessment (Signed)
[facility] INPATIENT:  Family/Significant Other Suicide Prevention Education  Suicide Prevention Education:  Education Completed; with mother Montavia Werre [phone], has been identified by the patient as the family member/significant other with whom the patient will be residing, and identified as the person(s) who will aid the patient in the event of a mental health crisis (suicidal ideations/suicide attempt).  With written consent from the patient, the family member/significant other has been provided the following suicide prevention education, prior to the and/or following the discharge of the patient.  The suicide prevention education provided includes the following:  Suicide risk factors  Suicide prevention and interventions  National Suicide Hotline telephone number  [facility] assessment telephone number  [facility] Emergency Assistance 911  [facility] and/or Residential Mobile Crisis Unit telephone number  Request made of family/significant other to:  Remove weapons (e.g., guns, rifles, knives), all items previously/currently identified as safety concern.  Ms. Cooler, verified that patient there are no guns or weapons in the home.  Remove drugs/medications (over-the-counter, prescriptions, illicit drugs), all items previously/currently identified as a safety concern.  The family member/significant other verbalizes understanding of the suicide prevention education information provided.  The family member/significant other agrees to remove the items of safety concern listed above.  Marian Sorrow, MSW Intern 12/30/2018, 4:16 PM

## 2018-12-30 NOTE — Plan of Care (Addendum)
Patient was flat and depressed upon approach. Denies SI HI AVH. Was tearful when talking about what happened preceding her hospitalization. Safety is maintained with 15 minute checks as well as environmental checks. Will continue to monitor and provide support.  Problem: Education: Goal: Emotional status will improve Outcome: Progressing Goal: Mental status will improve Outcome: Progressing Goal: Verbalization of understanding the information provided will improve Outcome: Progressing   Problem: Activity: Goal: Interest or engagement in activities will improve Outcome: Progressing

## 2018-12-30 NOTE — Progress Notes (Signed)
Recreation Therapy Notes  Date:  2.19. 20 Time: 0930 Location: [address] Dayroom  Group Topic: Stress Management  Goal Area(s) Addresses:  Patient will identify positive stress management techniques. Patient will identify benefits of using stress management post d/c.  Intervention: Stress Management  Activity :  Meditation.  LRT introduced the stress management technique of meditation.  LRT played a meditation that focused on pure possibilities.  Patients were to listen and follow along as meditation played to engage in activity.    Education:  Stress Management, Discharge Planning.   Education Outcome: Acknowledges Education  Clinical Observations/Feedback: Pt did not attend group.     Blong Busk, LRT/CTRS          Kassiah Mccrory A 12/30/2018 11:10 AM 

## 2018-12-30 NOTE — [facility] Group Notes (Signed)
Adult Psychoeducational Group Note  Date:  12/30/2018 Time:  10:43 PM  Group Topic/Focus:  Wrap-Up Group:   The focus of this group is to help patients review their daily goal of treatment and discuss progress on daily workbooks.  Participation Level:  Active  Participation Quality:  Appropriate and Attentive  Affect:  Appropriate  Cognitive:  Alert and Appropriate  Insight: Appropriate and Good  Engagement in Group:  Engaged  Modes of Intervention:  Discussion and Education  Additional Comments:  Pt attended and participated in wrap up group this evening. Pt rated their day a 6/10, due to them having a great day and getting rest. Pt is ready to be discharged.   Chrisandra Netters 12/30/2018, 10:43 PM
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
## 2018-12-30 NOTE — Tx Team (Signed)
Interdisciplinary Treatment and Diagnostic Plan Update  12/30/2018 Time of Session: 0830AM Christina Reese MRN: [id number]  Principal Diagnosis: MDD, recurrent, severe  Secondary Diagnoses: Active Problems:   MDD (major depressive disorder), recurrent episode, severe (HCC)   Current Medications:  Current Facility-Administered Medications  Medication Dose Route Frequency Provider Last Rate Last Dose  . acetaminophen (TYLENOL) tablet 650 mg  650 mg Oral Q6H PRN Maryagnes Amos, FNP   650 mg at 12/30/18 0824  . acetaminophen (TYLENOL) tablet 650 mg  650 mg Oral Once Maryagnes Amos, FNP      . alum & mag hydroxide-simeth (MAALOX/MYLANTA) 200-200-20 MG/5ML suspension 30 mL  30 mL Oral Q4H PRN Starkes-Perry, Juel Burrow, FNP      . magnesium hydroxide (MILK OF MAGNESIA) suspension 30 mL  30 mL Oral Daily PRN Starkes-Perry, Juel Burrow, FNP      . nicotine (NICODERM CQ - dosed in mg/24 hours) patch 21 mg  21 mg Transdermal Daily Starkes-Perry, Takia S, FNP      . ondansetron (ZOFRAN) tablet 4 mg  4 mg Oral Q8H PRN Starkes-Perry, Juel Burrow, FNP      . zolpidem (AMBIEN) tablet 5 mg  5 mg Oral QHS PRN Maryagnes Amos, FNP       PTA Medications: Medications Prior to Admission  Medication Sig Dispense Refill Last Dose  . fexofenadine (ALLEGRA) 60 MG tablet Take 60 mg by mouth daily as needed for allergies or rhinitis.   Past Month at Unknown time  . ibuprofen (ADVIL,MOTRIN) 600 MG tablet Take 1 tablet (600 mg total) by mouth every 6 (six) hours as needed. 30 tablet 0 Unknown at Unknown time  . naproxen sodium (ALEVE) 220 MG tablet Take 220 mg by mouth daily as needed (pain).   Past Week at Unknown time  . predniSONE (STERAPRED UNI-PAK 21 TAB) 10 MG (21) TBPK tablet Take by mouth daily. Take as directed. (Patient not taking: Reported on 12/29/2018) 21 tablet 0 Not Taking at Unknown time  . traMADol (ULTRAM) 50 MG tablet Take 1 tablet (50 mg total) by mouth every 6 (six) hours as needed for  moderate pain or severe pain. (Patient not taking: Reported on 12/29/2018) 20 tablet 0 Not Taking at Unknown time    Patient Stressors: Marital or family conflict  Patient Strengths: Ability for insight Average or above average intelligence Capable of independent living Communication skills General fund of knowledge Motivation for treatment/growth Supportive family/friends  Treatment Modalities: Medication Management, Group therapy, Case management,  1 to 1 session with clinician, Psychoeducation, Recreational therapy.   Physician Treatment Plan for Primary Diagnosis: MDD, recurrent, severe  Medication Management: Evaluate patient's response, side effects, and tolerance of medication regimen.  Therapeutic Interventions: 1 to 1 sessions, Unit Group sessions and Medication administration.  Evaluation of Outcomes: Progressing  Physician Treatment Plan for Secondary Diagnosis: Active Problems:   MDD (major depressive disorder), recurrent episode, severe (HCC)  Medication Management: Evaluate patient's response, side effects, and tolerance of medication regimen.  Therapeutic Interventions: 1 to 1 sessions, Unit Group sessions and Medication administration.  Evaluation of Outcomes: Progressing   RN Treatment Plan for Primary Diagnosis: MDD, recurrent, severe Long Term Goal(s): Knowledge of disease and therapeutic regimen to maintain health will improve  Short Term Goals: Ability to remain free from injury will improve, Ability to verbalize frustration and anger appropriately will improve, Ability to verbalize feelings will improve and Ability to disclose and discuss suicidal ideas  Medication Management: RN will administer medications as ordered  by provider, will assess and evaluate patient's response and provide education to patient for prescribed medication. RN will report any adverse and/or side effects to prescribing provider.  Therapeutic Interventions: 1 on 1 counseling  sessions, Psychoeducation, Medication administration, Evaluate responses to treatment, Monitor vital signs and CBGs as ordered, Perform/monitor CIWA, COWS, AIMS and Fall Risk screenings as ordered, Perform wound care treatments as ordered.  Evaluation of Outcomes: Progressing   LCSW Treatment Plan for Primary Diagnosis: MDD, recurrent, severe Long Term Goal(s): Safe transition to appropriate next level of care at discharge, Engage patient in therapeutic group addressing interpersonal concerns.  Short Term Goals: Engage patient in aftercare planning with referrals and resources, Facilitate patient progression through stages of change regarding substance use diagnoses and concerns and Identify triggers associated with mental health/substance abuse issues  Therapeutic Interventions: Assess for all discharge needs, 1 to 1 time with Social worker, Explore available resources and support systems, Assess for adequacy in community support network, Educate family and significant other(s) on suicide prevention, Complete Psychosocial Assessment, Interpersonal group therapy.  Evaluation of Outcomes: Progressing   Progress in Treatment: Attending groups: Yes. Participating in groups: Yes. Taking medication as prescribed: Yes. Toleration medication: Yes. Family/Significant other contact made: No, will contact:  family member if pt consents to collateral contact.  Patient understands diagnosis: Yes. Discussing patient identified problems/goals with staff: Yes. Medical problems stabilized or resolved: Yes. Denies suicidal/homicidal ideation: Yes. Issues/concerns per patient self-inventory: No. Other: n/a   New problem(s) identified: No, Describe:  n/a  New Short Term/Long Term Goal(s): detox, medication management for mood stabilization; elimination of SI thoughts; development of comprehensive mental wellness/sobriety plan.   Patient Goals:  "I need someone to talk to. Like a therapist."    Discharge Plan or Barriers: CSW assessing for appropriate referrals. [facility] pamphlet, Mobile Crisis information provided to patient for additional community support and resources.   Reason for Continuation of Hospitalization: Anxiety Depression Medication stabilization  Estimated Length of Stay: Thursday, 12/31/2018  Attendees: Patient: Christina Reese  12/30/2018 9:22 AM  Physician: Dr. Jeannine Kitten MD 12/30/2018 9:22 AM  Nursing: Arlyss Repress RN; Skylee RN 12/30/2018 9:22 AM  RN Care Manager:x 12/30/2018 9:22 AM  Social Worker: Corrie Mckusick LCSW 12/30/2018 9:22 AM  Recreational Therapist: x 12/30/2018 9:22 AM  Other: Armandina Stammer NP; Marciano Sequin NP 12/30/2018 9:22 AM  Other:  12/30/2018 9:22 AM  Other: 12/30/2018 9:22 AM    Scribe for Treatment Team: Rona Ravens, LCSW 12/30/2018 9:22 AM

## 2018-12-31 DIAGNOSIS — F332 Major depressive disorder, recurrent severe without psychotic features: Secondary | ICD-10-CM | POA: Diagnosis not present

## 2018-12-31 LAB — HEMOGLOBIN A1C
Hgb A1c MFr Bld: 5.3 % (ref 4.8–5.6)
Mean Plasma Glucose: 105 mg/dL

## 2018-12-31 MED ORDER — CLONAZEPAM 0.5 MG PO TABS
ORAL_TABLET | ORAL | Status: AC
  Filled 2018-12-31: qty 1

## 2018-12-31 MED ORDER — CLONAZEPAM 0.5 MG PO TABS
0.5000 mg | ORAL_TABLET | Freq: Three times a day (TID) | ORAL | Status: DC | PRN
  Administered 2018-12-31: 0.5 mg via ORAL

## 2018-12-31 NOTE — Plan of Care (Signed)
  Problem: Safety: Goal: Periods of time without injury will increase Outcome: Progressing   Problem: Education: Goal: Knowledge of [facility] Education information/materials will improve Outcome: Progressing

## 2018-12-31 NOTE — Progress Notes (Signed)
D: Patient observed in dayroom this evening since anxiety episode earlier. Attended group. Patient watchful, minimal interaction with peers. Patient's affect remains flat, mood depressed. Denies pain, physical complaints.   A: Medicated per orders, no prns given since klonopin earlier. Medication education provided. Level III obs in place for safety. Emotional support offered. Patient encouraged to complete Suicide Safety Plan before discharge. Encouraged to attend and participate in unit programming.   R: Patient verbalizes understanding of POC. On reassess, patient was calmer. Patient denies SI/HI/AVH and remains safe on level III obs. Will continue to monitor throughout the night.

## 2018-12-31 NOTE — Progress Notes (Signed)
D: Patient observed in dayroom with peers though interaction was minimal. Patient states she feels overwhelmed with life stressors. Patient's affect very flat, sad, depressed with congruent mood. Somewhat guarded. Denies pain, physical complaints.   A:  Medication education provided regarding new order for restoril however patient went to bed without informing this nurse she was ready to take it. Level III obs in place for safety. Emotional support offered. Patient encouraged to complete Suicide Safety Plan before discharge. Encouraged to attend and participate in unit programming.   R: Patient verbalizes understanding of POC. Patient denies SI/HI/AVH and remains safe on level III obs. Will continue to monitor throughout the night.

## 2018-12-31 NOTE — Progress Notes (Signed)
Patient observed in hallway with visitors. Suddenly became tearful, acting weak and as if she couldn't stand or walk. Assisted to chair in hallway. Patient complaining of chest pain of a 10/10 "all over my chest, it's sharp." No clamminess observed, no tingling or radiation reported. Patient remained tearful but would not open her eyes. Rapid breathing noted and patient encouraged to take slow, deep breaths. VS - 116/47, pulse 92, sats at 100% on RA. Reviewed EKG on 12/29/18 which showed NSR. Phoned Dr. Jama Flavors and assessment provided. Orders received to begin klonopin 0.5mg  TID PRN, give one now. Maalox given with klonopin as patient's mother, who was present for visitation, states patient suffers from heartburn and states she had pineapple with dinner. Patient medicated and presently sitting in dayroom. No longer tearful. Pitcher of ginger ale provided and fluids encouraged along with relaxation breathing which was demonstrated. Patient verbalizes understanding.

## 2018-12-31 NOTE — Progress Notes (Signed)
Patient ID: Christina Reese, female   DOB: Dec 14, 1995, 23 y.o.   MRN: [id number]  Nursing Progress Note [id number]  On initial approach, patient is seen sitting up in the dayroom with her eyes closed and is not engaging or interacting with peers. Patient presents flat, sad and sullen. Patient remains focused on discharge and appears to be minimizing her symptoms. Patient compliant with scheduled medications. Patient currently denies SI/HI/AVH.   Patient is educated about and provided medication per provider's orders. Patient safety maintained with q15 min safety checks and low fall risk precautions. Emotional support given, 1:1 interaction, and active listening provided. Patient encouraged to attend meals, groups, and work on treatment plan and goals. Labs, vital signs and patient behavior monitored throughout shift.   Patient contracts for safety with staff. Patient remains safe on the unit at this time and agrees to come to staff with any issues/concerns. Will continue to support and monitor.   Patient's self-inventory sheet Rated Energy Level  Low  Rated Sleep  Good  Rated Appetite  Poor  Rated Anxiety (0-10)  7  Rated Hopelessness (0-10)  2  Rated Depression (0-10)  0  Daily Goal  "stop crying and go home", "stop thinking about the other night"  Any Additional Comments:

## 2018-12-31 NOTE — [facility] Group Notes (Signed)
[facility] Group Therapy 12/31/2018 10:33 AM  Type of Therapy: Mental Health Association Presentation  Participation Level: Active  Participation Quality: Attentive  Affect: Appropriate  Cognitive: Oriented  Insight: Developing/Improving  Engagement in Therapy: Engaged  Modes of Intervention: Discussion, Education and Socialization   Summary of Progress/Problems: Mental Health Association (MHA) Speaker came to talk about his personal journey with mental health. The pt processed ways by which to relate to the speaker. MHA speaker provided handouts and educational information pertaining to groups and services offered by the [facility]. Pt was engaged in speaker's presentation and was receptive to resources provided.   Enid Cutter, MSW, [facility] 12/31/2018 10:33 AM
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
## 2019-01-01 DIAGNOSIS — F332 Major depressive disorder, recurrent severe without psychotic features: Secondary | ICD-10-CM | POA: Diagnosis not present

## 2019-01-01 MED ORDER — CLONAZEPAM 0.5 MG PO TABS: 0.5000 mg | ORAL_TABLET | Freq: Two times a day (BID) | ORAL | Status: DC | PRN

## 2019-01-01 MED ORDER — TEMAZEPAM 15 MG PO CAPS
15.0000 mg | ORAL_CAPSULE | Freq: Every day | ORAL | Status: DC
  Administered 2019-01-02: 15 mg via ORAL
  Filled 2019-01-01: qty 1

## 2019-01-01 NOTE — Progress Notes (Signed)
Adult Psychoeducational Group Note  Date:  01/01/2019 Time:  11:45 AM  Group Topic/Focus:  Goals Group:   The focus of this group is to help patients establish daily goals to achieve during treatment and discuss how the patient can incorporate goal setting into their daily lives to aide in recovery. Orientation:   The focus of this group is to educate the patient on the purpose and policies of crisis stabilization and provide a format to answer questions about their admission.  The group details unit policies and expectations of patients while admitted.  Participation Level:  Active  Participation Quality:  Appropriate  Affect:  Appropriate  Cognitive:  Alert  Insight: Appropriate  Engagement in Group:  Engaged  Modes of Intervention:  Discussion and Education  Additional Comments:    Pt participated in goals group. Pt's goal today is to work on not focusing on the negative thoughts and thinking positive instead.   Karren Cobble 01/01/2019, 11:45 AM

## 2019-01-01 NOTE — Progress Notes (Signed)
D Pt is observed OOB UAL on the 400 hall. She is guarded. Isolative. She is flat and depressed. Tight-lipped. When writer offeres her medicaiton, she replies" I don't need that..  I refuse it?". She wears her own clothes.      A She compelted her daily assessment and on this she wrote she deneid SI and she rated her dperession, hopelessness and anxiety " 0/0/0/", respectively. She does not engage in conversation with her peers, she says " when can I go home". She writes on her daily assessment " stay in a consistent mood" as her daily goal and the way she is going to accomplish this is to don't hold onto negative thoughts..I have been unable to reach this patient by phone.  A letter is being sent to the last known home address. Them pass".      R Safety is in place.

## 2019-01-01 NOTE — [facility] Group Notes (Signed)
Pt did not attend wrap up group this evening.  
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
## 2019-01-01 NOTE — Plan of Care (Signed)
  Problem: Education: Goal: Verbalization of understanding the information provided will improve Outcome: Not Progressing   Problem: Activity: Goal: Interest or engagement in activities will improve Outcome: Not Progressing

## 2019-01-02 DIAGNOSIS — F332 Major depressive disorder, recurrent severe without psychotic features: Secondary | ICD-10-CM | POA: Diagnosis not present
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
## 2019-01-02 NOTE — Plan of Care (Signed)
  Problem: Activity: Goal: Interest or engagement in activities will improve 01/02/2019 1227 by Rutherford Guys, RN Outcome: Progressing 01/02/2019 1220 by Rutherford Guys, RN Outcome: Progressing Goal: Sleeping patterns will improve 01/02/2019 1227 by Rutherford Guys, RN Outcome: Progressing 01/02/2019 1220 by Rutherford Guys, RN Outcome: Progressing

## 2019-01-02 NOTE — Plan of Care (Signed)
  Problem: Activity: Goal: Interest or engagement in activities will improve Outcome: Progressing Goal: Sleeping patterns will improve Outcome: Progressing   

## 2019-01-02 NOTE — [facility] Group Notes (Signed)
[facility] Group Notes:  (Nursing)  Date:  01/02/2019  Time: 1:15 PM Type of Therapy:  Nurse Education  Participation Level:  Active  Participation Quality:  Appropriate  Affect:  Appropriate  Cognitive:  Appropriate  Insight:  Appropriate  Engagement in Group:  Engaged  Modes of Intervention:  Education  Summary of Progress/Problems: Identifying Needs  Christina Reese 01/02/2019, 6:03 PM

## 2019-01-02 NOTE — Progress Notes (Signed)
Adult Psychoeducational Group Note  Date:  01/02/2019 Time:  9:05am-10:05am   Group Topic/Focus:  Goals Group:   The focus of this group is to help patients establish daily goals to achieve during treatment and discuss how the patient can incorporate goal setting into their daily lives to aide in recovery.  Participation Level:  Active  Participation Quality:  Appropriate, Attentive and Supportive  Affect:  Appropriate  Cognitive:  Alert, Appropriate and Oriented  Insight: Appropriate  Engagement in Group:  Engaged and Supportive  Modes of Intervention:  Discussion and Support  Additional Comments:  Patient informed the group that her goal for the day is to stay strong and try to open up. Patient informed the group that she plans to achieve this goal by staying out of bed. Patient informed the group that she would rate her day a 7 because of her back pain. Patient informed the group that she is looking forward to going home.   Annye Asa 01/02/2019, 6:07 PM

## 2019-01-02 NOTE — Progress Notes (Signed)
Writer spoke with patient 1:1 this morning. She reported that her back hurt and she was offered tylenol for pain or heat packs and she declined reporting that she has scoliosis and neither would help. Writer encouraged her to try and be up in the dayroom more if feeling better today and not isolate in her room so much. She reported that she would try. Support and encouragement given, safety maintained on unit with 15 min checks.

## 2019-01-02 NOTE — [facility] Counselor (Signed)
Clinical Social Work Note  At pt request, met with her 1:1.  She is very concerned about getting back to school, and says when she leaves she will probably not need a doctor's note because her school has said that it will not help.  She is supposed to complete all her schoolwork and turn it in no later than Tuesday 01/05/2019.  She is asking to be discharged tomorrow in order to work on this, saying that her depression is significantly improved now.  Selmer Dominion, LCSW 01/02/2019, 5:47 PM

## 2019-01-02 NOTE — [facility] Group Notes (Signed)
Adult Psychoeducational Group Note  Date:  01/02/2019 Time:  9:32 PM  Group Topic/Focus:  Wrap-Up Group:   The focus of this group is to help patients review their daily goal of treatment and discuss progress on daily workbooks.  Participation Level:  Active  Participation Quality:  Appropriate and Attentive  Affect:  Appropriate  Cognitive:  Alert and Appropriate  Insight: Appropriate and Good  Engagement in Group:  Engaged  Modes of Intervention:  Discussion and Education  Additional Comments:  Pt attended and participated in wrap up group this evening.  Pt rated their day a 9/10, due to their day being up and down. Pt completed their goal to have more positive thoughts.    Chrisandra Netters 01/02/2019, 9:32 PM

## 2019-01-02 NOTE — Progress Notes (Signed)
D Pt presents flat, depressed and unengaged in her groups. She is upset today, crying and agitated she says " I need t go home.Marland Kitchenibuprofen need to get on with my life..she doesn't understand this". PC received fr pt's mother and mother is complaining that patient is "upset because the only thing she eats is chicken finger, french fries and pizza and she cant get any of that there". RN spoke with cafeteria personnel who confirms these are not available options for lunch and /or dinner this weekend.       A Pt completed daily assessment and on this she wrote she denied having SI today and she rated her depression, hopelessness and anxiety " 1/0/1", respectively. Writer requested Dr Jama Flavors speak with pt ( he did ) and pt calmer afterwards, stating " he says I can probably go home tomorrow.     R Safety in place.

## 2019-01-02 NOTE — Progress Notes (Signed)
Patient lyin gin bed asleep with eyes closed and respirations even, no distress noted. Safety maintained with 15 min checks.

## 2019-01-02 NOTE — [facility] Group Notes (Signed)
LCSW Group Therapy Note  01/02/2019    10:00-11:00am   Type of Therapy and Topic:  Group Therapy: Early Messages Received About Anger  Participation Level:  Active   Description of Group:   In this group, patients shared and discussed the early messages received in their lives about anger through parental or other adult modeling, teaching, repression, punishment, violence, and more.  Participants identified how those childhood lessons influence even now how they usually or often react when angered.  The group discussed that anger is a secondary emotion and what may be the underlying emotional themes that come out through anger outbursts or that are ignored through anger suppression.  Finally, as a group there was a conversation about the workbook's quote that "There is nothing wrong with anger; it is just a sign something needs to change."     Therapeutic Goals: 1. Patients will identify one or more childhood message about anger that they received and how it was taught to them. 2. Patients will discuss how these childhood experiences have influenced and continue to influence their own expression or repression of anger even today. 3. Patients will explore possible primary emotions that tend to fuel their secondary emotion of anger. 4. Patients will learn that anger itself is normal and cannot be eliminated, and that healthier coping skills can assist with resolving conflict rather than worsening situations.  Summary of Patient Progress:  The patient shared that her childhood lessons about anger were from a single mother who was in a lot of different relationships with various men that did not go well, and she would then take out her anger on her 3 daughters, who then turned around and took it out on each other.  As a result, they were an angry household and often would not talk to each other.  Now she tries very hard to keep her anger inside.  Therapeutic Modalities:   Cognitive Behavioral  Therapy Motivation Interviewing  Lynnell Chad  .

## 2019-01-03 DIAGNOSIS — F332 Major depressive disorder, recurrent severe without psychotic features: Secondary | ICD-10-CM | POA: Diagnosis not present

## 2019-01-03 MED ORDER — VORTIOXETINE HBR 10 MG PO TABS: 10.0000 mg | ORAL_TABLET | Freq: Every day | ORAL | 0 refills | Status: DC

## 2019-01-03 MED ORDER — NICOTINE 21 MG/24HR TD PT24: 21.0000 mg | MEDICATED_PATCH | Freq: Every day | TRANSDERMAL | 0 refills | Status: DC

## 2019-01-03 MED ORDER — TEMAZEPAM 15 MG PO CAPS: 15.0000 mg | ORAL_CAPSULE | Freq: Every day | ORAL | 0 refills | Status: DC

## 2019-01-03 NOTE — Progress Notes (Signed)
Writer spoke with patient 1:1 and she reported that her day was not so good when she found out she was not discharging and became upset. She has been observed up in the dayroom and had a friend to visit her on tonight. She reports that hopefully she will be able to go home on tomorrow b/c she reported that the bed hurts her back and she has scoliosis. Pain medication offered and she declined stating that no pills help her back pain. Heat packs given for comfort. Support given and safety maintained on unit with 15 min checks.
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
## 2019-01-03 NOTE — Progress Notes (Signed)
Pt received both written and verbal discharge instructions. Pt verbalized understanding of discharge instructions. Pt agreed to f/u appt and med regimen. Pt received AVS, SRA, transitional record and a suicide prevention worksheet. Pt received a letter of hospitalization from CSW. Pt gathered belongings from room and locker. Pt was safety discharged to the lobby.  

## 2019-01-03 NOTE — Progress Notes (Signed)
  [facility] Adult Case Management Discharge Plan :  Will you be returning to the same living situation after discharge:  Yes,  lives alone At discharge, do you have transportation home?: Yes,  family to pick up Do you have the ability to pay for your medications: No.  Will need assistance from community agency  Release of information consent forms completed and turned in to Medical Records by CSW.   Patient to Follow up at: Follow-up Information    [facility] Follow up.   Specialty:  Behavioral Health Why:  If your insurance gets cancelled, you may also follow-up at [name] for outpatient medication management and therapy. Walk in hours: Mon-Fri 8am-10am. Thank you.  Contact information: [address] [place] [address] [phone]        [facility] Follow up on 01/11/2019.   Why:  Therapy appointment with Leta Jungling is Monday, 3/2 at 11:30a.  Medication management appointment with Clovis Fredrickson is 3/3 at 11:00a.  Please call within 24 hours of discharge to hold appointments and pay the $20 deposit.  Contact information: [address] [place] [address] [phone]           Next level of care provider has access to [facility] Link:no  Safety Planning and Suicide Prevention discussed: Yes,  with mother  Have you used any form of tobacco in the last 30 days? (Cigarettes, Smokeless Tobacco, Cigars, and/or Pipes): No  Has patient been referred to the Quitline?: N/A patient is not a smoker  Patient has been referred for addiction treatment: N/A  Lynnell Chad, LCSW 01/03/2019, 9:42 AM
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
## 2019-01-03 NOTE — [facility] Group Notes (Signed)
[facility] LCSW Group Therapy Note  01/03/2019   10:00-11:00AM  Type of Therapy and Topic:  Group Therapy:  Unhealthy versus Healthy Supports, Which Am I?  Participation Level:  Active   Description of Group:  Patients in this group were introduced to the concept that additional supports including self-support are an essential part of recovery.  Initially a discussion was held about the differences between healthy versus unhealthy supports.  Patients were asked to share what unhealthy supports in their lives need to be addressed, as well as what additional healthy supports could be added for greater help in reaching their goals.   A song entitled "My Own Hero" was played and a group discussion ensued in which patients stated they could relate to the song and it inspired them to realize they have be willing to help themselves in order to succeed, because other people cannot achieve sobriety or stability for them.  We discussed adding a variety of healthy supports to address the various needs in patient lives, including becoming more self-supportive.  Therapeutic Goals: 1)  Highlight the differences between healthy and unhealthy supports 2)  Suggest the importance of being a part of one's own support system 2)  Discuss reasons people in one's life may eventually be unable to be continually supportive  3)  Identify the patient's current support system and   4)  elicit commitments to add healthy supports and to become more conscious of being self-supportive   Summary of Patient Progress:  The patient expressed that the unhealthy support which needs to be addressed includes herself and sometimes family members.  Healthy supports which could be added for increased stability and happiness include relying on family more heavily that are supportive in attitude, as well as follow-up appointmnets.  Therapeutic Modalities:   Motivational Interviewing Activity  Lynnell Chad

## 2019-07-13 ENCOUNTER — Other Ambulatory Visit: Payer: Self-pay

## 2019-07-13 ENCOUNTER — Ambulatory Visit (HOSPITAL_COMMUNITY)
Admission: EM | Admit: 2019-07-13 | Discharge: 2019-07-13 | Disposition: A | Discharge status: Home / Self Care | Payer: Self-pay | Attending: Family Medicine | Admitting: Family Medicine

## 2019-07-13 ENCOUNTER — Encounter (HOSPITAL_COMMUNITY): Payer: Self-pay

## 2019-07-13 DIAGNOSIS — F332 Major depressive disorder, recurrent severe without psychotic features: Secondary | ICD-10-CM | POA: Insufficient documentation

## 2019-07-13 DIAGNOSIS — Z20828 Contact with and (suspected) exposure to other viral communicable diseases: Secondary | ICD-10-CM | POA: Insufficient documentation

## 2019-07-13 DIAGNOSIS — Z91040 Latex allergy status: Secondary | ICD-10-CM | POA: Insufficient documentation

## 2019-07-13 DIAGNOSIS — Z79899 Other long term (current) drug therapy: Secondary | ICD-10-CM | POA: Insufficient documentation

## 2019-07-13 DIAGNOSIS — G43909 Migraine, unspecified, not intractable, without status migrainosus: Secondary | ICD-10-CM | POA: Insufficient documentation

## 2019-07-13 DIAGNOSIS — Z791 Long term (current) use of non-steroidal anti-inflammatories (NSAID): Secondary | ICD-10-CM | POA: Insufficient documentation

## 2019-07-13 DIAGNOSIS — R110 Nausea: Secondary | ICD-10-CM | POA: Insufficient documentation

## 2019-07-13 DIAGNOSIS — Z87891 Personal history of nicotine dependence: Secondary | ICD-10-CM | POA: Insufficient documentation

## 2019-07-13 DIAGNOSIS — Z882 Allergy status to sulfonamides status: Secondary | ICD-10-CM | POA: Insufficient documentation

## 2019-07-13 DIAGNOSIS — J029 Acute pharyngitis, unspecified: Secondary | ICD-10-CM | POA: Insufficient documentation

## 2019-07-13 DIAGNOSIS — M419 Scoliosis, unspecified: Secondary | ICD-10-CM | POA: Insufficient documentation

## 2019-07-13 LAB — POCT RAPID STREP A: Streptococcus, Group A Screen (Direct): NEGATIVE

## 2019-07-13 MED ORDER — CETIRIZINE HCL 10 MG PO CAPS: 10.0000 mg | ORAL_CAPSULE | Freq: Every day | ORAL | 0 refills | Status: DC

## 2019-07-13 MED ORDER — ONDANSETRON 4 MG PO TBDP: 4.0000 mg | ORAL_TABLET | Freq: Three times a day (TID) | ORAL | 0 refills | Status: DC | PRN

## 2019-07-13 MED ORDER — IBUPROFEN 800 MG PO TABS
800.0000 mg | ORAL_TABLET | Freq: Once | ORAL | Status: AC
  Administered 2019-07-13: 800 mg via ORAL

## 2019-07-13 MED ORDER — IBUPROFEN 800 MG PO TABS: 800.0000 mg | ORAL_TABLET | Freq: Three times a day (TID) | ORAL | 0 refills | Status: DC

## 2019-07-13 MED ORDER — ONDANSETRON 4 MG PO TBDP
ORAL_TABLET | ORAL | Status: AC
  Filled 2019-07-13: qty 1

## 2019-07-13 MED ORDER — ONDANSETRON 4 MG PO TBDP
4.0000 mg | ORAL_TABLET | Freq: Once | ORAL | Status: AC
  Administered 2019-07-13: 4 mg via ORAL

## 2019-07-13 MED ORDER — IBUPROFEN 800 MG PO TABS
ORAL_TABLET | ORAL | Status: AC
  Filled 2019-07-13: qty 1

## 2019-07-13 NOTE — ED Triage Notes (Signed)
Patient presents to Urgent Care with complaints of sore throat, headache, and nausea since last night. Patient reports no other sick contacts.

## 2019-07-13 NOTE — ED Provider Notes (Signed)
[facility]    CSN: [id number] Arrival date & time: 07/13/19  1020      History   Chief Complaint Chief Complaint  Patient presents with  . Sore Throat  . Nausea  . Migraine    HPI Christina Reese is a 23 y.o. female history of depression presenting today for evaluation of a sore throat.  Patient states that beginning last night she started to develop some mild throat discomfort.  Since overnight and this morning she has developed headache, body aches as well as some nausea.  Denies vomiting, abdominal pain or diarrhea.  She has not noted a fever.  She did try some NyQuil.  Denies close contacts that have been sick.  Denies recent travel.  Denies known exposure to COVID or strep.  Denies rhinorrhea and cough.  Denies chest pain or shortness of breath.  HPI  Past Medical History:  Diagnosis Date  . Depression   . Scoliosis     Patient Active Problem List   Diagnosis Date Noted  . MDD (major depressive disorder), recurrent episode, severe (HCC) 12/29/2018  . Bacterial vaginitis 02/04/2018  . Pelvic pain 02/04/2018    Past Surgical History:  Procedure Laterality Date  . ABDOMINAL SURGERY      OB History    Gravida  1   Para      Term      Preterm      AB  1   Living        SAB  1   TAB      Ectopic      Multiple      Live Births               Home Medications    Prior to Admission medications   Medication Sig Start Date End Date Taking? Authorizing Provider  Cetirizine HCl 10 MG CAPS Take 1 capsule (10 mg total) by mouth daily for 14 days. 07/13/19 07/27/19  Jaylia Pettus C, PA-C  fexofenadine (ALLEGRA) 60 MG tablet Take 60 mg by mouth daily as needed for allergies or rhinitis.    [provider]  ibuprofen (ADVIL) 800 MG tablet Take 1 tablet (800 mg total) by mouth 3 (three) times daily. 07/13/19   Lucendia Leard C, PA-C  nicotine (NICODERM CQ - DOSED IN MG/24 HOURS) 21 mg/24hr patch Place 1 patch (21 mg total) onto the skin  daily. For smoking cessation 01/03/19   Aldean Baker, NP  ondansetron (ZOFRAN ODT) 4 MG disintegrating tablet Take 1 tablet (4 mg total) by mouth every 8 (eight) hours as needed for nausea or vomiting. 07/13/19   Zalyn Amend C, PA-C  temazepam (RESTORIL) 15 MG capsule Take 1 capsule (15 mg total) by mouth at bedtime. 01/03/19   Aldean Baker, NP  vortioxetine HBr (TRINTELLIX) 10 MG TABS tablet Take 1 tablet (10 mg total) by mouth daily. For mood 01/03/19   Aldean Baker, NP    Family History Family History  Problem Relation Age of Onset  . Healthy Mother     Social History Social History   Tobacco Use  . Smoking status: Former Smoker    Types: Cigarettes  . Smokeless tobacco: Never Used  Substance Use Topics  . Alcohol use: Yes    Comment: occasionally  . Drug use: No     Allergies   Latex and Sulfa antibiotics   Review of Systems Review of Systems  Constitutional: Positive for chills and fatigue. Negative for activity  change, appetite change and fever.  HENT: Positive for sore throat. Negative for congestion, ear pain, rhinorrhea, sinus pressure and trouble swallowing.   Eyes: Negative for discharge and redness.  Respiratory: Negative for cough, chest tightness and shortness of breath.   Cardiovascular: Negative for chest pain.  Gastrointestinal: Positive for nausea. Negative for abdominal pain, diarrhea and vomiting.  Musculoskeletal: Positive for myalgias.  Skin: Negative for rash.  Neurological: Negative for dizziness, light-headedness and headaches.     Physical Exam Triage Vital Signs ED Triage Vitals  Enc Vitals Group     BP 07/13/19 1147 109/71     Pulse Rate 07/13/19 1147 (!) 118     Resp 07/13/19 1147 19     Temp 07/13/19 1147 100 F (37.8 C)     Temp Source 07/13/19 1147 Oral     SpO2 07/13/19 1147 100 %     Weight --      Height --      Head Circumference --      Peak Flow --      Pain Score 07/13/19 1145 8     Pain Loc --      Pain Edu? --       Excl. in GC? --    No data found.  Updated Vital Signs BP 109/71 (BP Location: Left Arm)   Pulse (!) 118   Temp 100 F (37.8 C) (Oral)   Resp 19   LMP 06/14/2019 (Exact Date)   SpO2 100%   Visual Acuity Right Eye Distance:   Left Eye Distance:   Bilateral Distance:    Right Eye Near:   Left Eye Near:    Bilateral Near:     Physical Exam Vitals signs and nursing note reviewed.  Constitutional:      General: She is not in acute distress.    Appearance: She is well-developed.  HENT:     Head: Normocephalic and atraumatic.     Ears:     Comments: Bilateral ears without tenderness to palpation of external auricle, tragus and mastoid, EAC's without erythema or swelling, TM's with good bony landmarks and cone of light.  Left TM with very small amount of erythema noted to inferior aspect of TM, otherwise good bony landmarks and cone of light    Mouth/Throat:     Comments: Oral mucosa pink and moist, mild tonsillar enlargement with slight erythema, no exudate, mild erythema noted to the uvula, no petechiae, no swelling. Posterior pharynx patent and nonerythematous, no uvula deviation or swelling. Normal phonation.  No soft palate swelling Eyes:     Conjunctiva/sclera: Conjunctivae normal.  Neck:     Musculoskeletal: Neck supple.     Comments: Tonsillar tenderness and mild lymphadenopathy noted bilaterally  No other neck swelling palpated Cardiovascular:     Rate and Rhythm: Regular rhythm. Tachycardia present.     Heart sounds: No murmur.  Pulmonary:     Effort: Pulmonary effort is normal. No respiratory distress.     Breath sounds: Normal breath sounds.     Comments: Breathing comfortably at rest, CTABL, no wheezing, rales or other adventitious sounds auscultated Abdominal:     Palpations: Abdomen is soft.     Tenderness: There is no abdominal tenderness.  Skin:    General: Skin is warm and dry.  Neurological:     Mental Status: She is alert.      UC  Treatments / Results  Labs (all labs ordered are listed, but only abnormal results are displayed) Labs  Reviewed  NOVEL CORONAVIRUS, NAA (HOSP ORDER, SEND-OUT TO REF LAB; TAT 18-24 HRS)  CULTURE, GROUP A STREP [facility])  POCT RAPID STREP A    EKG   Radiology No results found.  Procedures Procedures (including critical care time)  Medications Ordered in UC Medications  ibuprofen (ADVIL) tablet 800 mg (800 mg Oral Given 07/13/19 1218)  ondansetron (ZOFRAN-ODT) disintegrating tablet 4 mg (4 mg Oral Given 07/13/19 1218)  ibuprofen (ADVIL) 800 MG tablet (has no administration in time range)  ondansetron (ZOFRAN-ODT) 4 MG disintegrating tablet (has no administration in time range)    Initial Impression / Assessment and Plan / UC Course  I have reviewed the triage vital signs and the nursing notes.  Pertinent labs & imaging results that were available during my care of the patient were reviewed by me and considered in my medical decision making (see chart for details).    Patient with low-grade fever 100, tachycardic averaging around 120.  Strep test negative.  Strep culture pending.  COVID pending.  No obvious swelling noted in oropharynx or soft palate of mouth, no signs of deep space infection on neck.  Unclear cause moderate tachycardia at this time.  Patient is stable.  Discussed this concern with patient and advised to monitor symptoms over the next 24 to 48 hours, follow-up with worsening or not resolving.  In the meantime will recommend symptomatic and supportive care, pushing fluids/oral hydration.Discussed strict return precautions. Patient verbalized understanding and is agreeable with plan.   Final Clinical Impressions(s) / UC Diagnoses   Final diagnoses:  Sore throat     Discharge Instructions     Sore Throat  Your rapid strep tested Negative today. We will send for a culture and call in about 2 days if results are positive.  COVID swab is also pending.  Please continue  Tylenol or Ibuprofen for fever and pain. May try salt water gargles, cepacol lozenges, throat spray, or OTC cold relief medicine for throat discomfort. If you also have congestion take a daily anti-histamine like Zyrtec, Claritin, and a oral decongestant to help with post nasal drip that may be irritating your throat.   Stay hydrated and drink plenty of fluids to keep your throat coated relieve irritation.   Follow-up if developing increased pain, swelling, difficulty swallowing, persistent fevers, lightheadedness or dizziness      Person Under Monitoring Name: Christina Reese  Location: [address] [place] [address]   Infection Prevention Recommendations for Individuals Confirmed to have, or Being Evaluated for, 2019 Novel Coronavirus (COVID-19) Infection Who Receive Care at Home  Individuals who are confirmed to have, or are being evaluated for, COVID-19 should follow the prevention steps below until a healthcare provider or local or state health department says they can return to normal activities.  Stay home except to get medical care You should restrict activities outside your home, except for getting medical care. Do not go to work, school, or public areas, and do not use public transportation or taxis.  Call ahead before visiting your doctor Before your medical appointment, call the healthcare provider and tell them that you have, or are being evaluated for, COVID-19 infection. This will help the healthcare provider's office take steps to keep other people from getting infected. Ask your healthcare provider to call the local or state health department.  Monitor your symptoms Seek prompt medical attention if your illness is worsening (e.g., difficulty breathing). Before going to your medical appointment, call the healthcare provider and tell  them that you have, or are being evaluated for, COVID-19 infection. Ask your healthcare provider to call the local or  state health department.  Wear a facemask You should wear a facemask that covers your nose and mouth when you are in the same room with other people and when you visit a healthcare provider. People who live with or visit you should also wear a facemask while they are in the same room with you.  Separate yourself from other people in your home As much as possible, you should stay in a different room from other people in your home. Also, you should use a separate bathroom, if available.  Avoid sharing household items You should not share dishes, drinking glasses, cups, eating utensils, towels, bedding, or other items with other people in your home. After using these items, you should wash them thoroughly with soap and water.  Cover your coughs and sneezes Cover your mouth and nose with a tissue when you cough or sneeze, or you can cough or sneeze into your sleeve. Throw used tissues in a lined trash can, and immediately wash your hands with soap and water for at least 20 seconds or use an alcohol-based hand rub.  Wash your [employer]hands Wash your hands often and thoroughly with soap and water for at least 20 seconds. You can use an alcohol-based hand sanitizer if soap and water are not available and if your hands are not visibly dirty. Avoid touching your eyes, nose, and mouth with unwashed hands.   Prevention Steps for Caregivers and Household Members of Individuals Confirmed to have, or Being Evaluated for, COVID-19 Infection Being Cared for in the Home  If you live with, or provide care at home for, a person confirmed to have, or being evaluated for, COVID-19 infection please follow these guidelines to prevent infection:  Follow healthcare provider's instructions Make sure that you understand and can help the patient follow any healthcare provider instructions for all care.  Provide for the patient's basic needs You should help the patient with basic needs in the home and provide support  for getting groceries, prescriptions, and other personal needs.  Monitor the patient's symptoms If they are getting sicker, call his or her medical provider and tell them that the patient has, or is being evaluated for, COVID-19 infection. This will help the healthcare provider's office take steps to keep other people from getting infected. Ask the healthcare provider to call the local or state health department.  Limit the number of people who have contact with the patient  If possible, have only one caregiver for the patient.  Other household members should stay in another home or place of residence. If this is not possible, they should stay  in another room, or be separated from the patient as much as possible. Use a separate bathroom, if available.  Restrict visitors who do not have an essential need to be in the home.  Keep older adults, very young children, and other sick people away from the patient Keep older adults, very young children, and those who have compromised immune systems or chronic health conditions away from the patient. This includes people with chronic heart, lung, or kidney conditions, diabetes, and cancer.  Ensure good ventilation Make sure that shared spaces in the home have good air flow, such as from an air conditioner or an opened window, weather permitting.  Wash your hands often  Wash your hands often and thoroughly with soap and water for at least 20 seconds.  You can use an alcohol based hand sanitizer if soap and water are not available and if your hands are not visibly dirty.  Avoid touching your eyes, nose, and mouth with unwashed hands.  Use disposable paper towels to dry your hands. If not available, use dedicated cloth towels and replace them when they become wet.  Wear a facemask and gloves  Wear a disposable facemask at all times in the room and gloves when you touch or have contact with the patient's blood, body fluids, and/or secretions or  excretions, such as sweat, saliva, sputum, nasal mucus, vomit, urine, or feces.  Ensure the mask fits over your nose and mouth tightly, and do not touch it during use.  Throw out disposable facemasks and gloves after using them. Do not reuse.  Wash your hands immediately after removing your facemask and gloves.  If your personal clothing becomes contaminated, carefully remove clothing and launder. Wash your hands after handling contaminated clothing.  Place all used disposable facemasks, gloves, and other waste in a lined container before disposing them with other household waste.  Remove gloves and wash your hands immediately after handling these items.  Do not share dishes, glasses, or other household items with the patient  Avoid sharing household items. You should not share dishes, drinking glasses, cups, eating utensils, towels, bedding, or other items with a patient who is confirmed to have, or being evaluated for, COVID-19 infection.  After the person uses these items, you should wash them thoroughly with soap and water.  Wash laundry thoroughly  Immediately remove and wash clothes or bedding that have blood, body fluids, and/or secretions or excretions, such as sweat, saliva, sputum, nasal mucus, vomit, urine, or feces, on them.  Wear gloves when handling laundry from the patient.  Read and follow directions on labels of laundry or clothing items and detergent. In general, wash and dry with the warmest temperatures recommended on the label.  Clean all areas the individual has used often  Clean all touchable surfaces, such as counters, tabletops, doorknobs, bathroom fixtures, toilets, phones, keyboards, tablets, and bedside tables, every day. Also, clean any surfaces that may have blood, body fluids, and/or secretions or excretions on them.  Wear gloves when cleaning surfaces the patient has come in contact with.  Use a diluted bleach solution (e.g., dilute bleach with 1 part  bleach and 10 parts water) or a household disinfectant with a label that says EPA-registered for coronaviruses. To make a bleach solution at home, add 1 tablespoon of bleach to 1 quart (4 cups) of water. For a larger supply, add  cup of bleach to 1 gallon (16 cups) of water.  Read labels of cleaning products and follow recommendations provided on product labels. Labels contain instructions for safe and effective use of the cleaning product including precautions you should take when applying the product, such as wearing gloves or eye protection and making sure you have good ventilation during use of the product.  Remove gloves and wash hands immediately after cleaning.  Monitor yourself for signs and symptoms of illness Caregivers and household members are considered close contacts, should monitor their health, and will be asked to limit movement outside of the home to the extent possible. Follow the monitoring steps for close contacts listed on the symptom monitoring form.   ? If you have additional questions, contact your local health department or call the epidemiologist on call at [phone] (available 24/7). ? This guidance is subject to change. For the most up-to-date  guidance from [facility], please refer to their website: [email or web address]    ED Prescriptions    Medication Sig Dispense Auth. Provider   ibuprofen (ADVIL) 800 MG tablet Take 1 tablet (800 mg total) by mouth 3 (three) times daily. 21 tablet Payten Beaumier C, PA-C   ondansetron (ZOFRAN ODT) 4 MG disintegrating tablet Take 1 tablet (4 mg total) by mouth every 8 (eight) hours as needed for nausea or vomiting. 20 tablet Ai Sonnenfeld C, PA-C   Cetirizine HCl 10 MG CAPS Take 1 capsule (10 mg total) by mouth daily for 14 days. 14 capsule Modesta Sammons C, PA-C     Controlled Substance Prescriptions [place] Controlled Substance Registry consulted? Not Applicable   Janith Lima, [place] 07/13/19 1330

## 2019-07-13 NOTE — Discharge Instructions (Addendum)
Sore Throat  Your rapid strep tested Negative today. We will send for a culture and call in about 2 days if results are positive.  COVID swab is also pending.  Please continue Tylenol or Ibuprofen for fever and pain. May try salt water gargles, cepacol lozenges, throat spray, or OTC cold relief medicine for throat discomfort. If you also have congestion take a daily anti-histamine like Zyrtec, Claritin, and a oral decongestant to help with post nasal drip that may be irritating your throat.   Stay hydrated and drink plenty of fluids to keep your throat coated relieve irritation.   Follow-up if developing increased pain, swelling, difficulty swallowing, persistent fevers, lightheadedness or dizziness      Person Under Monitoring Name: Christina Reese  Location: [address] [place] [address]   Infection Prevention Recommendations for Individuals Confirmed to have, or Being Evaluated for, 2019 Novel Coronavirus (COVID-19) Infection Who Receive Care at Home  Individuals who are confirmed to have, or are being evaluated for, COVID-19 should follow the prevention steps below until a healthcare provider or local or state health department says they can return to normal activities.  Stay home except to get medical care You should restrict activities outside your home, except for getting medical care. Do not go to work, school, or public areas, and do not use public transportation or taxis.  Call ahead before visiting your doctor Before your medical appointment, call the healthcare provider and tell them that you have, or are being evaluated for, COVID-19 infection. This will help the healthcare providers office take steps to keep other people from getting infected. Ask your healthcare provider to call the local or state health department.  Monitor your symptoms Seek prompt medical attention if your illness is worsening (e.g., difficulty breathing). Before going to your  medical appointment, call the healthcare provider and tell them that you have, or are being evaluated for, COVID-19 infection. Ask your healthcare provider to call the local or state health department.  Wear a facemask You should wear a facemask that covers your nose and mouth when you are in the same room with other people and when you visit a healthcare provider. People who live with or visit you should also wear a facemask while they are in the same room with you.  Separate yourself from other people in your home As much as possible, you should stay in a different room from other people in your home. Also, you should use a separate bathroom, if available.  Avoid sharing household items You should not share dishes, drinking glasses, cups, eating utensils, towels, bedding, or other items with other people in your home. After using these items, you should wash them thoroughly with soap and water.  Cover your coughs and sneezes Cover your mouth and nose with a tissue when you cough or sneeze, or you can cough or sneeze into your sleeve. Throw used tissues in a lined trash can, and immediately wash your hands with soap and water for at least 20 seconds or use an alcohol-based hand rub.  Wash your [facility] your hands often and thoroughly with soap and water for at least 20 seconds. You can use an alcohol-based hand sanitizer if soap and water are not available and if your hands are not visibly dirty. Avoid touching your eyes, nose, and mouth with unwashed hands.   Prevention Steps for Caregivers and Household Members of Individuals Confirmed to have, or Being Evaluated for, COVID-19 Infection Being Cared for  in the Home  If you live with, or provide care at home for, a person confirmed to have, or being evaluated for, COVID-19 infection please follow these guidelines to prevent infection:  Follow healthcare providers instructions Make sure that you understand and can help the  patient follow any healthcare provider instructions for all care.  Provide for the patients basic needs You should help the patient with basic needs in the home and provide support for getting groceries, prescriptions, and other personal needs.  Monitor the patients symptoms If they are getting sicker, call his or her medical provider and tell them that the patient has, or is being evaluated for, COVID-19 infection. This will help the healthcare providers office take steps to keep other people from getting infected. Ask the healthcare provider to call the local or state health department.  Limit the number of people who have contact with the patient If possible, have only one caregiver for the patient. Other household members should stay in another home or place of residence. If this is not possible, they should stay in another room, or be separated from the patient as much as possible. Use a separate bathroom, if available. Restrict visitors who do not have an essential need to be in the home.  Keep older adults, very young children, and other sick people away from the patient Keep older adults, very young children, and those who have compromised immune systems or chronic health conditions away from the patient. This includes people with chronic heart, lung, or kidney conditions, diabetes, and cancer.  Ensure good ventilation Make sure that shared spaces in the home have good air flow, such as from an air conditioner or an opened window, weather permitting.  Wash your hands often Wash your hands often and thoroughly with soap and water for at least 20 seconds. You can use an alcohol based hand sanitizer if soap and water are not available and if your hands are not visibly dirty. Avoid touching your eyes, nose, and mouth with unwashed hands. Use disposable paper towels to dry your hands. If not available, use dedicated cloth towels and replace them when they become wet.  Wear a  facemask and gloves Wear a disposable facemask at all times in the room and gloves when you touch or have contact with the patients blood, body fluids, and/or secretions or excretions, such as sweat, saliva, sputum, nasal mucus, vomit, urine, or feces.  Ensure the mask fits over your nose and mouth tightly, and do not touch it during use. Throw out disposable facemasks and gloves after using them. Do not reuse. Wash your hands immediately after removing your facemask and gloves. If your personal clothing becomes contaminated, carefully remove clothing and launder. Wash your hands after handling contaminated clothing. Place all used disposable facemasks, gloves, and other waste in a lined container before disposing them with other household waste. Remove gloves and wash your hands immediately after handling these items.  Do not share dishes, glasses, or other household items with the patient Avoid sharing household items. You should not share dishes, drinking glasses, cups, eating utensils, towels, bedding, or other items with a patient who is confirmed to have, or being evaluated for, COVID-19 infection. After the person uses these items, you should wash them thoroughly with soap and water.  Wash laundry thoroughly Immediately remove and wash clothes or bedding that have blood, body fluids, and/or secretions or excretions, such as sweat, saliva, sputum, nasal mucus, vomit, urine, or feces, on them. Wear gloves  when handling laundry from the patient. Read and follow directions on labels of laundry or clothing items and detergent. In general, wash and dry with the warmest temperatures recommended on the label.  Clean all areas the individual has used often Clean all touchable surfaces, such as counters, tabletops, doorknobs, bathroom fixtures, toilets, phones, keyboards, tablets, and bedside tables, every day. Also, clean any surfaces that may have blood, body fluids, and/or secretions or excretions  on them. Wear gloves when cleaning surfaces the patient has come in contact with. Use a diluted bleach solution (e.g., dilute bleach with 1 part bleach and 10 parts water) or a household disinfectant with a label that says EPA-registered for coronaviruses. To make a bleach solution at home, add 1 tablespoon of bleach to 1 quart (4 cups) of water. For a larger supply, add  cup of bleach to 1 gallon (16 cups) of water. Read labels of cleaning products and follow recommendations provided on product labels. Labels contain instructions for safe and effective use of the cleaning product including precautions you should take when applying the product, such as wearing gloves or eye protection and making sure you have good ventilation during use of the product. Remove gloves and wash hands immediately after cleaning.  Monitor yourself for signs and symptoms of illness Caregivers and household members are considered close contacts, should monitor their health, and will be asked to limit movement outside of the home to the extent possible. Follow the monitoring steps for close contacts listed on the symptom monitoring form.   ? If you have additional questions, contact your local health department or call the epidemiologist on call at [phone] (available 24/7). ? This guidance is subject to change. For the most up-to-date guidance from [facility], please refer to their website: [email or web address]

## 2019-07-14 LAB — NOVEL CORONAVIRUS, NAA (HOSP ORDER, SEND-OUT TO REF LAB; TAT 18-24 HRS): SARS-CoV-2, NAA: NOT DETECTED

## 2019-07-14 LAB — CULTURE, GROUP A STREP (THRC)

## 2019-07-19 ENCOUNTER — Telehealth (HOSPITAL_COMMUNITY): Payer: Self-pay | Admitting: Emergency Medicine

## 2019-07-19 NOTE — Telephone Encounter (Signed)
Strep shows not group a, called patient to see how she is feeling, pt states she is feeling better, no more sore throat. NO treatment indicated.
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
## 2019-12-07 ENCOUNTER — Encounter (HOSPITAL_COMMUNITY): Payer: Self-pay

## 2019-12-07 ENCOUNTER — Ambulatory Visit (INDEPENDENT_AMBULATORY_CARE_PROVIDER_SITE_OTHER)
Admission: RE | Admit: 2019-12-07 | Discharge: 2019-12-07 | Disposition: A | Discharge status: Home / Self Care | Payer: BLUE CROSS/BLUE SHIELD | Source: Ambulatory Visit

## 2019-12-07 ENCOUNTER — Ambulatory Visit (HOSPITAL_COMMUNITY)
Admission: EM | Admit: 2019-12-07 | Discharge: 2019-12-07 | Disposition: A | Discharge status: Home / Self Care | Payer: BLUE CROSS/BLUE SHIELD | Attending: Family Medicine | Admitting: Family Medicine

## 2019-12-07 ENCOUNTER — Other Ambulatory Visit: Payer: Self-pay

## 2019-12-07 DIAGNOSIS — B349 Viral infection, unspecified: Secondary | ICD-10-CM | POA: Diagnosis not present

## 2019-12-07 DIAGNOSIS — R197 Diarrhea, unspecified: Secondary | ICD-10-CM

## 2019-12-07 DIAGNOSIS — Z87891 Personal history of nicotine dependence: Secondary | ICD-10-CM | POA: Insufficient documentation

## 2019-12-07 DIAGNOSIS — T881XXA Other complications following immunization, not elsewhere classified, initial encounter: Secondary | ICD-10-CM | POA: Diagnosis not present

## 2019-12-07 DIAGNOSIS — M419 Scoliosis, unspecified: Secondary | ICD-10-CM | POA: Diagnosis not present

## 2019-12-07 DIAGNOSIS — Z20822 Contact with and (suspected) exposure to covid-19: Secondary | ICD-10-CM | POA: Diagnosis not present

## 2019-12-07 DIAGNOSIS — R059 Cough, unspecified: Secondary | ICD-10-CM

## 2019-12-07 DIAGNOSIS — K529 Noninfective gastroenteritis and colitis, unspecified: Secondary | ICD-10-CM | POA: Insufficient documentation

## 2019-12-07 DIAGNOSIS — R112 Nausea with vomiting, unspecified: Secondary | ICD-10-CM | POA: Diagnosis not present

## 2019-12-07 DIAGNOSIS — R05 Cough: Secondary | ICD-10-CM | POA: Insufficient documentation

## 2019-12-07 MED ORDER — ONDANSETRON HCL 4 MG PO TABS: 4.0000 mg | ORAL_TABLET | Freq: Four times a day (QID) | ORAL | 0 refills | Status: DC | PRN

## 2019-12-07 NOTE — Discharge Instructions (Addendum)
You have been tested for COVID-19 today. If your test returns positive, you will receive a phone call from [facility] regarding your results. Negative test results are not called. Both positive and negative results area always visible on MyChart. If you do not have a MyChart account, sign up instructions are provided in your discharge papers. Please do not hesitate to contact us should you have questions or concerns.  Please do your best to ensure adequate fluid intake in order to avoid dehydration. If you find that you are unable to tolerate drinking fluids regularly please proceed to the Emergency Department for evaluation.  Also, you should return to the hospital if you experience persistent fevers for greater than 1-2 more days, increasing abdominal pain that persists despite medications, persistent diarrhea, dizziness, syncope (fainting), or for any other concerns you may deem worrisome.  

## 2019-12-07 NOTE — Discharge Instructions (Addendum)
Take the prescribed antinausea medication as needed for your nausea and vomiting.  Take Tylenol as needed for fever or body aches.  Rest and keep yourself hydrated with clear liquids, such as Sprite or ginger ale.    Come into the nearest [facility] to get a Covid test today.    You should self quarantine until your COVID test result is back.  Go to the emergency department if you develop high fever, shortness of breath, severe diarrhea, or other concerning symptoms.

## 2019-12-07 NOTE — ED Provider Notes (Signed)
Virtual Visit via Video Note:  Christina Reese  initiated request for Telemedicine visit with [facility] team. I connected with Christina Reese  on 12/07/2019 at 3:13 PM  for a synchronized telemedicine visit using a video enabled HIPPA compliant telemedicine application. I verified that I am speaking with Christina Reese  using two identifiers. Christina Bail, NP  was physically located in a [facility] Urgent care site and Christina Reese was located at a different location.   The limitations of evaluation and management by telemedicine as well as the availability of in-person appointments were discussed. Patient was informed that she  may incur a bill ( including co-pay) for this virtual visit encounter. Christina Reese  expressed understanding and gave verbal consent to proceed with virtual visit.     History of Present Illness:Christina Reese  is a 24 y.o. female presents for evaluation of one day history of nausea, vomiting, diarrhea, low grade temp, sore throat, nonproductive cough.  Tmax 99.2.  She reports two episodes of emesis and three episodes of diarrhea.  She denies rash, shortness of breath, or other symptoms.  Treatment attempted at home with Tylenol.  No recent COVID tests.  LMP: 11/15/2019; she denies pregnancy or breastfeeding.     Allergies  Allergen Reactions  . Latex   . Sulfa Antibiotics      Past Medical History:  Diagnosis Date  . Depression   . Scoliosis      Social History   Tobacco Use  . Smoking status: Former Smoker    Types: Cigarettes  . Smokeless tobacco: Never Used  Substance Use Topics  . Alcohol use: Yes    Comment: occasionally  . Drug use: No        Observations/Objective: Physical Exam  VITALS: Patient denies fever. GENERAL: Alert, appears well and in no acute distress. HEENT: Atraumatic. NECK: Normal movements of the head and neck. CARDIOPULMONARY: No increased WOB. Speaking in clear sentences. I:E ratio WNL.  MS: Moves all  visible extremities without noticeable abnormality. PSYCH: Pleasant and cooperative, well-groomed. Speech normal rate and rhythm. Affect is appropriate. Insight and judgement are appropriate. Attention is focused, linear, and appropriate.  NEURO: CN grossly intact. Oriented as arrived to appointment on time with no prompting. Moves both UE equally.  SKIN: No obvious lesions, wounds, erythema, or cyanosis noted on face or hands.   Assessment and Plan:    ICD-10-CM   1. Viral illness  B34.9        Follow Up Instructions: Symptomatic treatment with Zofran, Tylenol, rest, clear liquids.  Instructed patient to come to the urgent care for a Covid test today.  Instructed her to self quarantine until the test result is back.  Instructed her to go to the emergency department if she develops high fever, shortness of breath, severe diarrhea, or other concerning symptoms.  Patient agrees to plan of care.    I discussed the assessment and treatment plan with the patient. The patient was provided an opportunity to ask questions and all were answered. The patient agreed with the plan and demonstrated an understanding of the instructions.   The patient was advised to call back or seek an in-person evaluation if the symptoms worsen or if the condition fails to improve as anticipated.      Christina Bail, NP  12/07/2019 3:13 PM         Christina Bail, NP 12/07/19 1513

## 2019-12-07 NOTE — ED Triage Notes (Addendum)
Pt states she had a virtual visit and was told to come in for a Covid test. Pt cc headache, cough and diarrhea and vomiting. X 3 days. Pt was given a D tap injection in her left deltoid it's red , hot and swollen at the injection site.

## 2019-12-08 NOTE — ED Provider Notes (Signed)
[facility]   [id number] 12/07/19 Arrival Time: 1025  ASSESSMENT & PLAN:  1. Local reaction to immunization, initial encounter   2. Cough   3. Gastroenteritis     COVID testing sent. See letter for self-isolation guidelines. OTC symptom care as needed. Declines Rx Zofran. No signs of dehydration requiring IVF.  Discussed typical duration of symptoms for suspected viral GI illness. Will do her best to ensure adequate fluid intake in order to avoid dehydration. Will proceed to the Emergency Department for evaluation if unable to tolerate PO fluids regularly.  Otherwise she will f/u with her PCP or here if not showing improvement over the next 48-72 hours.  No sign of infection at Tdap injection site. Improving.  Reviewed expectations re: course of current medical issues. Questions answered. Outlined signs and symptoms indicating need for more acute intervention. Patient verbalized understanding. After Visit Summary given.   SUBJECTIVE: History from: patient.  Christina Reese is a 24 y.o. female who presents with complaint of non-bilious, non-bloody intermittent n/v with non-bloody diarrhea. Onset fairly abrupt; one day ago. Abdominal discomfort: mild and cramping. Symptoms are stable since beginning. Aggravating factors: none. Alleviating factors: none. Associated symptoms: non-productive cough. She denies chills, constipation and diarrhea. Appetite: decreased. PO intake: decreased. Ambulatory without assistance. Urinary symptoms: none. Sick contacts: none. Recent travel or camping: none. OTC treatment: Tylenol without much relief. Telemedicine visit eariler today reviewed by me. Recommendation to come in for evaluation and COVID testing.  Patient's last menstrual period was 11/15/2019.  Past Surgical History:  Procedure Laterality Date  . ABDOMINAL SURGERY     Also mentions having Tdap 3 days ago. Still with pain around injection site; L deltoid. Mild erythema  but this is decreasing.  OBJECTIVE:  Vitals:   12/07/19 1630 12/07/19 1631  BP:  128/76  Pulse:  88  Resp:  16  Temp:  98.1 F (36.7 C)  TempSrc:  Oral  SpO2:  100%  Weight: 73.9 kg     General appearance: alert; no distress; appears fatigued Oropharynx: moist Lungs: clear to auscultation bilaterally; unlabored Heart: regular rate and rhythm Abdomen: soft; non-distended; no significant abdominal tenderness; reports "cramping" feeling; no guarding or rebound tenderness Back: no CVA tenderness Extremities: no edema; symmetrical with no gross deformities Skin: warm; dry Neurologic: normal gait Psychological: alert and cooperative; normal mood and affect  Labs:  Labs Reviewed  NOVEL CORONAVIRUS, NAA (HOSP ORDER, SEND-OUT TO REF LAB; TAT 18-24 HRS)     Allergies  Allergen Reactions  . Latex   . Sulfa Antibiotics                                                Past Medical History:  Diagnosis Date  . Depression   . Scoliosis    Social History   Socioeconomic History  . Marital status: Single    Spouse name: Not on file  . Number of children: Not on file  . Years of education: Not on file  . Highest education level: Not on file  Occupational History  . Not on file  Tobacco Use  . Smoking status: Former Smoker    Types: Cigarettes  . Smokeless tobacco: Never Used  Substance and Sexual Activity  . Alcohol use: Yes    Comment: occasionally  . Drug use: No  . Sexual activity: Not on file  Other  Topics Concern  . Not on file  Social History Narrative  . Not on file   Social Determinants of Health   Financial Resource Strain:   . Difficulty of Paying Living Expenses: Not on file  Food Insecurity:   . Worried About Programme researcher, broadcasting/film/video in the Last Year: Not on file  . Ran Out of Food in the Last Year: Not on file  Transportation Needs:   . Lack of Transportation (Medical): Not on file  . Lack of Transportation (Non-Medical): Not on file  Physical  Activity:   . Days of Exercise per Week: Not on file  . Minutes of Exercise per Session: Not on file  Stress:   . Feeling of Stress : Not on file  Social Connections:   . Frequency of Communication with Friends and Family: Not on file  . Frequency of Social Gatherings with Friends and Family: Not on file  . Attends Religious Services: Not on file  . Active Member of Clubs or Organizations: Not on file  . Attends Banker Meetings: Not on file  . Marital Status: Not on file  Intimate Partner Violence:   . Fear of Current or Ex-Partner: Not on file  . Emotionally Abused: Not on file  . Physically Abused: Not on file  . Sexually Abused: Not on file   Family History  Problem Relation Age of Onset  . Healthy Mother      Mardella Layman, MD 12/08/19 [phone]

## 2019-12-09 LAB — NOVEL CORONAVIRUS, NAA (HOSP ORDER, SEND-OUT TO REF LAB; TAT 18-24 HRS): SARS-CoV-2, NAA: NOT DETECTED

## 2019-12-13 DIAGNOSIS — Z113 Encounter for screening for infections with a predominantly sexual mode of transmission: Secondary | ICD-10-CM | POA: Diagnosis not present

## 2019-12-13 DIAGNOSIS — B373 Candidiasis of vulva and vagina: Secondary | ICD-10-CM | POA: Diagnosis not present

## 2019-12-13 DIAGNOSIS — N939 Abnormal uterine and vaginal bleeding, unspecified: Secondary | ICD-10-CM | POA: Diagnosis not present

## 2019-12-13 DIAGNOSIS — Z01419 Encounter for gynecological examination (general) (routine) without abnormal findings: Secondary | ICD-10-CM | POA: Diagnosis not present

## 2019-12-13 DIAGNOSIS — Z6841 Body Mass Index (BMI) 40.0 and over, adult: Secondary | ICD-10-CM | POA: Diagnosis not present

## 2019-12-13 DIAGNOSIS — Z124 Encounter for screening for malignant neoplasm of cervix: Secondary | ICD-10-CM | POA: Diagnosis not present

## 2019-12-13 DIAGNOSIS — Z309 Encounter for contraceptive management, unspecified: Secondary | ICD-10-CM | POA: Diagnosis not present

## 2020-02-01 DIAGNOSIS — F1020 Alcohol dependence, uncomplicated: Secondary | ICD-10-CM | POA: Diagnosis not present

## 2020-02-01 DIAGNOSIS — F603 Borderline personality disorder: Secondary | ICD-10-CM | POA: Diagnosis not present

## 2020-02-01 DIAGNOSIS — F4310 Post-traumatic stress disorder, unspecified: Secondary | ICD-10-CM | POA: Diagnosis not present

## 2020-02-01 DIAGNOSIS — F332 Major depressive disorder, recurrent severe without psychotic features: Secondary | ICD-10-CM | POA: Diagnosis not present

## 2020-02-04 DIAGNOSIS — Z6841 Body Mass Index (BMI) 40.0 and over, adult: Secondary | ICD-10-CM | POA: Diagnosis not present

## 2020-02-04 DIAGNOSIS — N939 Abnormal uterine and vaginal bleeding, unspecified: Secondary | ICD-10-CM | POA: Diagnosis not present

## 2020-02-04 DIAGNOSIS — N915 Oligomenorrhea, unspecified: Secondary | ICD-10-CM | POA: Diagnosis not present

## 2020-02-28 DIAGNOSIS — N62 Hypertrophy of breast: Secondary | ICD-10-CM | POA: Diagnosis not present

## 2020-03-11 HISTORY — PX: BREAST SURGERY: SHX581

## 2020-03-15 DIAGNOSIS — E669 Obesity, unspecified: Secondary | ICD-10-CM | POA: Diagnosis not present

## 2020-03-16 DIAGNOSIS — M545 Low back pain: Secondary | ICD-10-CM | POA: Diagnosis not present

## 2020-03-16 DIAGNOSIS — M546 Pain in thoracic spine: Secondary | ICD-10-CM | POA: Diagnosis not present

## 2020-03-20 ENCOUNTER — Telehealth: Payer: BC Managed Care – PPO | Admitting: Family

## 2020-03-20 ENCOUNTER — Inpatient Hospital Stay
Admission: RE | Admit: 2020-03-20 | Discharge: 2020-03-20 | Disposition: A | Discharge status: Home / Self Care | Payer: BLUE CROSS/BLUE SHIELD | Source: Ambulatory Visit

## 2020-03-20 DIAGNOSIS — M546 Pain in thoracic spine: Secondary | ICD-10-CM | POA: Diagnosis not present

## 2020-03-20 MED ORDER — BACLOFEN 10 MG PO TABS: 10.0000 mg | ORAL_TABLET | Freq: Three times a day (TID) | ORAL | 0 refills | Status: DC

## 2020-03-20 MED ORDER — NAPROXEN 500 MG PO TABS: 500.0000 mg | ORAL_TABLET | Freq: Two times a day (BID) | ORAL | 0 refills | Status: DC

## 2020-03-20 NOTE — Progress Notes (Signed)

## 2020-04-16 ENCOUNTER — Telehealth: Payer: BC Managed Care – PPO | Admitting: Family

## 2020-04-16 DIAGNOSIS — J029 Acute pharyngitis, unspecified: Secondary | ICD-10-CM

## 2020-04-16 MED ORDER — AMOXICILLIN 500 MG PO CAPS: 500.0000 mg | ORAL_CAPSULE | Freq: Two times a day (BID) | ORAL | 0 refills | Status: DC

## 2020-04-16 MED ORDER — AMOXICILLIN 400 MG/5ML PO SUSR: 500.0000 mg | Freq: Two times a day (BID) | ORAL | 0 refills | Status: AC

## 2020-04-16 NOTE — Addendum Note (Signed)
Addended by: Jannifer Rodney A on: 04/16/2020 01:53 PM   Modules accepted: Orders

## 2020-04-16 NOTE — Progress Notes (Signed)

## 2020-04-24 ENCOUNTER — Telehealth: Payer: BC Managed Care – PPO | Admitting: Emergency Medicine

## 2020-04-24 ENCOUNTER — Ambulatory Visit: Admit: 2020-04-24 | Discharge status: Against Medical Advice | Payer: BC Managed Care – PPO

## 2020-04-24 DIAGNOSIS — M546 Pain in thoracic spine: Secondary | ICD-10-CM

## 2020-04-24 DIAGNOSIS — N62 Hypertrophy of breast: Secondary | ICD-10-CM | POA: Diagnosis not present

## 2020-04-24 NOTE — Progress Notes (Signed)
Based on what you shared with me, I feel your condition warrants further evaluation and I recommend that you be seen for a face to face office visit.   NOTE: If you entered your credit card information for this eVisit, you will not be charged. You may see a "hold" on your card for the $35 but that hold will drop off and you will not have a charge processed.   If you are having a true medical emergency please call 911.      For an urgent face to face visit, [facility] has five urgent care centers for your convenience:      NEW:  [facility] [place] Get Driving Directions [phone] [address] [place], [place] [address] . 10 am - 6pm Monday - Friday    [facility] Get Driving Directions [phone] [address] [place], [place] [address] . 10 am to 8 pm Monday-Friday . 12 pm to 8 pm Saturday-Sunday     [facility] Get Driving Directions [phone] [address] [place], [place] [address] . 8 am to 8 pm Monday-Friday . 9 am to 6 pm Saturday . 11 am to 6 pm Sunday     [facility] [place] Get Driving Directions  [phone] [address] [place], [place] [address] . 8 am to 8 pm Monday-Friday . 8 am to 4 pm Saturday-Sunday   [facility] [place] Get Driving Directions [phone] [address] [place],  [address] . 12 pm to 6 pm Monday-Friday      Your e-visit answers were reviewed by a board certified advanced clinical practitioner to complete your personal care plan.  Thank you for using e-Visits.    Greater than 5 but less than 10 minutes spent researching, coordinating, and implementing care for this patient today  

## 2020-05-12 DIAGNOSIS — Z01818 Encounter for other preprocedural examination: Secondary | ICD-10-CM | POA: Diagnosis not present

## 2020-05-18 ENCOUNTER — Other Ambulatory Visit: Payer: Self-pay | Admitting: Plastic Surgery

## 2020-05-18 DIAGNOSIS — N62 Hypertrophy of breast: Secondary | ICD-10-CM | POA: Diagnosis not present

## 2020-07-03 ENCOUNTER — Telehealth: Payer: BC Managed Care – PPO | Admitting: Family

## 2020-07-03 DIAGNOSIS — A084 Viral intestinal infection, unspecified: Secondary | ICD-10-CM | POA: Diagnosis not present

## 2020-07-03 MED ORDER — ONDANSETRON HCL 4 MG PO TABS: 4.0000 mg | ORAL_TABLET | Freq: Three times a day (TID) | ORAL | 0 refills | Status: DC | PRN

## 2020-07-03 NOTE — Progress Notes (Signed)

## 2020-08-17 DIAGNOSIS — L719 Rosacea, unspecified: Secondary | ICD-10-CM | POA: Diagnosis not present

## 2020-08-21 ENCOUNTER — Telehealth: Payer: BC Managed Care – PPO | Admitting: Emergency Medicine

## 2020-08-21 DIAGNOSIS — R519 Headache, unspecified: Secondary | ICD-10-CM | POA: Diagnosis not present

## 2020-08-21 DIAGNOSIS — R1110 Vomiting, unspecified: Secondary | ICD-10-CM | POA: Diagnosis not present

## 2020-08-21 MED ORDER — ONDANSETRON HCL 4 MG PO TABS: 4.0000 mg | ORAL_TABLET | Freq: Three times a day (TID) | ORAL | 0 refills | Status: DC | PRN

## 2020-08-21 NOTE — Progress Notes (Signed)
We are sorry that you are not feeling well. Here is how we plan to help!  Based on what you have shared with me it looks like you have a Virus that is irritating your GI tract.  Vomiting is the forceful emptying of a portion of the stomach's content through the mouth.  Although nausea and vomiting can make you feel miserable, it's important to remember that these are not diseases, but rather symptoms of an underlying illness.  When we treat short term symptoms, we always caution that any symptoms that persist should be fully evaluated in a medical office.  I have prescribed a medication that will help alleviate your symptoms and allow you to stay hydrated:  Zofran 4 mg 1 tablet every 8 hours as needed for nausea and vomiting  HOME CARE:  Drink clear liquids.  This is very important! Dehydration (the lack of fluid) can lead to a serious complication.  Start off with 1 tablespoon every 5 minutes for 8 hours.  You may begin eating bland foods after 8 hours without vomiting.  Start with saltine crackers, white bread, rice, mashed potatoes, applesauce.  After 48 hours on a bland diet, you may resume a normal diet.  Try to go to sleep.  Sleep often empties the stomach and relieves the need to vomit.  GET HELP RIGHT AWAY IF:   Your symptoms do not improve or worsen within 2 days after treatment.  You have a fever for over 3 days.  You cannot keep down fluids after trying the medication.  MAKE SURE YOU:   Understand these instructions.  Will watch your condition.  Will get help right away if you are not doing well or get worse.   Thank you for choosing an e-visit. Your e-visit answers were reviewed by a board certified advanced clinical practitioner to complete your personal care plan. Depending upon the condition, your plan could have included both over the counter or prescription medications. Please review your pharmacy choice. Be sure that the pharmacy you have chosen is open so  that you can pick up your prescription now.  If there is a problem you may message your provider in MyChart to have the prescription routed to another pharmacy. Your safety is important to [place]. If you have drug allergies check your prescription carefully.  For the next 24 hours, you can use MyChart to ask questions about today's visit, request a non-urgent call back, or ask for a work or school excuse from your e-visit provider. You will get an e-mail in the next two days asking about your experience. I hope that your e-visit has been valuable and will speed your recovery.   **Please do not respond to this message unless you have follow up questions.** Greater than 5 but less than 10 minutes spent researching, coordinating, and implementing care for this patient today

## 2020-08-29 DIAGNOSIS — Z3689 Encounter for other specified antenatal screening: Secondary | ICD-10-CM | POA: Diagnosis not present

## 2020-08-29 DIAGNOSIS — Z3200 Encounter for pregnancy test, result unknown: Secondary | ICD-10-CM | POA: Diagnosis not present

## 2020-09-06 DIAGNOSIS — Z3201 Encounter for pregnancy test, result positive: Secondary | ICD-10-CM | POA: Diagnosis not present

## 2020-09-10 DIAGNOSIS — Z20822 Contact with and (suspected) exposure to covid-19: Secondary | ICD-10-CM | POA: Diagnosis not present

## 2020-09-27 ENCOUNTER — Inpatient Hospital Stay (HOSPITAL_COMMUNITY)
Admission: AD | Admit: 2020-09-27 | Discharge: 2020-09-27 | Disposition: A | Discharge status: Home / Self Care | Payer: BC Managed Care – PPO | Attending: Obstetrics and Gynecology | Admitting: Obstetrics and Gynecology

## 2020-09-27 ENCOUNTER — Other Ambulatory Visit: Payer: Self-pay

## 2020-09-27 ENCOUNTER — Inpatient Hospital Stay (HOSPITAL_COMMUNITY): Payer: BC Managed Care – PPO

## 2020-09-27 ENCOUNTER — Encounter (HOSPITAL_COMMUNITY): Payer: Self-pay | Admitting: Obstetrics and Gynecology

## 2020-09-27 DIAGNOSIS — M419 Scoliosis, unspecified: Secondary | ICD-10-CM | POA: Diagnosis not present

## 2020-09-27 DIAGNOSIS — B9689 Other specified bacterial agents as the cause of diseases classified elsewhere: Secondary | ICD-10-CM | POA: Insufficient documentation

## 2020-09-27 DIAGNOSIS — N760 Acute vaginitis: Secondary | ICD-10-CM

## 2020-09-27 DIAGNOSIS — M25561 Pain in right knee: Secondary | ICD-10-CM | POA: Diagnosis not present

## 2020-09-27 DIAGNOSIS — Z3A11 11 weeks gestation of pregnancy: Secondary | ICD-10-CM | POA: Diagnosis not present

## 2020-09-27 DIAGNOSIS — O99891 Other specified diseases and conditions complicating pregnancy: Secondary | ICD-10-CM

## 2020-09-27 DIAGNOSIS — Z87891 Personal history of nicotine dependence: Secondary | ICD-10-CM | POA: Insufficient documentation

## 2020-09-27 DIAGNOSIS — O99341 Other mental disorders complicating pregnancy, first trimester: Secondary | ICD-10-CM | POA: Diagnosis not present

## 2020-09-27 DIAGNOSIS — R109 Unspecified abdominal pain: Secondary | ICD-10-CM | POA: Diagnosis not present

## 2020-09-27 DIAGNOSIS — O26891 Other specified pregnancy related conditions, first trimester: Secondary | ICD-10-CM | POA: Insufficient documentation

## 2020-09-27 DIAGNOSIS — M79651 Pain in right thigh: Secondary | ICD-10-CM | POA: Diagnosis not present

## 2020-09-27 DIAGNOSIS — O23591 Infection of other part of genital tract in pregnancy, first trimester: Secondary | ICD-10-CM | POA: Diagnosis not present

## 2020-09-27 DIAGNOSIS — F329 Major depressive disorder, single episode, unspecified: Secondary | ICD-10-CM | POA: Insufficient documentation

## 2020-09-27 DIAGNOSIS — O26851 Spotting complicating pregnancy, first trimester: Secondary | ICD-10-CM | POA: Insufficient documentation

## 2020-09-27 DIAGNOSIS — M5431 Sciatica, right side: Secondary | ICD-10-CM

## 2020-09-27 DIAGNOSIS — M5441 Lumbago with sciatica, right side: Secondary | ICD-10-CM | POA: Insufficient documentation

## 2020-09-27 LAB — COMPREHENSIVE METABOLIC PANEL
ALT: 18 U/L (ref 0–44)
AST: 19 U/L (ref 15–41)
Albumin: 3.8 g/dL (ref 3.5–5.0)
Alkaline Phosphatase: 40 U/L (ref 38–126)
Anion gap: 12 (ref 5–15)
BUN: 5 mg/dL — ABNORMAL LOW (ref 6–20)
CO2: 21 mmol/L — ABNORMAL LOW (ref 22–32)
Calcium: 9.8 mg/dL (ref 8.9–10.3)
Chloride: 104 mmol/L (ref 98–111)
Creatinine, Ser: 0.68 mg/dL (ref 0.44–1.00)
GFR, Estimated: >60 mL/min (ref >60)
Glucose, Bld: 82 mg/dL (ref 70–99)
Potassium: 3.8 mmol/L (ref 3.5–5.1)
Sodium: 137 mmol/L (ref 135–145)
Total Bilirubin: 0.6 mg/dL (ref 0.3–1.2)
Total Protein: 7.3 g/dL (ref 6.5–8.1)

## 2020-09-27 LAB — CBC WITH DIFFERENTIAL/PLATELET
Abs Immature Granulocytes: 0.04 10*3/uL (ref 0.00–0.07)
Basophils Absolute: 0 10*3/uL (ref 0.0–0.1)
Basophils Relative: 0 %
Eosinophils Absolute: 0.1 10*3/uL (ref 0.0–0.5)
Eosinophils Relative: 1 %
HCT: 42 % (ref 36.0–46.0)
Hemoglobin: 14.2 g/dL (ref 12.0–15.0)
Immature Granulocytes: 0 %
Lymphocytes Relative: 33 %
Lymphs Abs: 3.9 10*3/uL (ref 0.7–4.0)
MCH: 28.7 pg (ref 26.0–34.0)
MCHC: 33.8 g/dL (ref 30.0–36.0)
MCV: 85 fL (ref 80.0–100.0)
Monocytes Absolute: 1 10*3/uL (ref 0.1–1.0)
Monocytes Relative: 9 %
Neutro Abs: 6.6 10*3/uL (ref 1.7–7.7)
Neutrophils Relative %: 57 %
Platelets: 301 10*3/uL (ref 150–400)
RBC: 4.94 MIL/uL (ref 3.87–5.11)
RDW: 14.2 % (ref 11.5–15.5)
WBC: 11.7 10*3/uL — ABNORMAL HIGH (ref 4.0–10.5)
nRBC: 0 % (ref 0.0–0.2)

## 2020-09-27 LAB — URINALYSIS, ROUTINE W REFLEX MICROSCOPIC
Bilirubin Urine: NEGATIVE
Glucose, UA: NEGATIVE mg/dL
Hgb urine dipstick: NEGATIVE
Ketones, ur: 80 mg/dL — AB
Nitrite: NEGATIVE
Protein, ur: 30 mg/dL — AB
Specific Gravity, Urine: 1.024 (ref 1.005–1.030)
pH: 5 (ref 5.0–8.0)

## 2020-09-27 LAB — WET PREP, GENITAL
Sperm: NONE SEEN
Trich, Wet Prep: NONE SEEN
Yeast Wet Prep HPF POC: NONE SEEN

## 2020-09-27 LAB — POCT PREGNANCY, URINE: Preg Test, Ur: POSITIVE — AB

## 2020-09-27 MED ORDER — ACETAMINOPHEN 500 MG PO TABS
1000.0000 mg | ORAL_TABLET | Freq: Once | ORAL | Status: AC
  Administered 2020-09-27: 1000 mg via ORAL
  Filled 2020-09-27: qty 2

## 2020-09-27 MED ORDER — METRONIDAZOLE 500 MG PO TABS: 500.0000 mg | ORAL_TABLET | Freq: Two times a day (BID) | ORAL | 0 refills | Status: AC

## 2020-09-27 NOTE — MAU Note (Signed)
Presents with c/o abdominal cramping and spotting with wiping earlier today.  Reports currently not spotting.  LMP 07/09/20.  Reports cramping is only on right side and starts mid abdomen and radiates down into right leg.

## 2020-09-27 NOTE — MAU Provider Note (Signed)
History     CSN: [id number]  Arrival date and time: 09/27/20 1750   First Provider Initiated Contact with Patient 09/27/20 1852      Chief Complaint  Patient presents with  . Abdominal Pain  . Spotting   HPI   Ms.Christina Reese is a 24 y.o. is a 24 y.o.female G2P0010 @ [redacted]w[redacted]d with a history of scoliosis,  here in MAU with complaints of R sided abdominal pain that radiates around to her lower back and down to her right leg. The pain stops at her knee. She reports no erythema or point tenderness. She has never had this pain before.  The pain started over the weekend. She has not tried any pain medication over the counter.  Patient reports starting her care at [facility] and left d/t insurance. Reports having an [place] there which showed an IUP and was told everything with baby looked normal.  OB History    Gravida  2   Para      Term      Preterm      AB  1   Living        SAB  1   TAB      Ectopic      Multiple      Live Births              Past Medical History:  Diagnosis Date  . Depression   . Scoliosis     Past Surgical History:  Procedure Laterality Date  . ABDOMINAL SURGERY    . BREAST SURGERY  03/2020   Breast reduction    Family History  Problem Relation Age of Onset  . Healthy Mother     Social History   Tobacco Use  . Smoking status: Former Smoker    Types: Cigarettes  . Smokeless tobacco: Never Used  Vaping Use  . Vaping Use: Never used  Substance Use Topics  . Alcohol use: Not Currently    Comment: occasionally  . Drug use: No    Allergies:  Allergies  Allergen Reactions  . Latex   . Sulfa Antibiotics     Medications Prior to Admission  Medication Sig Dispense Refill Last Dose  . baclofen (LIORESAL) 10 MG tablet Take 1 tablet (10 mg total) by mouth 3 (three) times daily. 30 each 0   . Cetirizine HCl 10 MG CAPS Take 1 capsule (10 mg total) by mouth daily for 14 days. 14 capsule 0   . fexofenadine (ALLEGRA) 60 MG  tablet Take 60 mg by mouth daily as needed for allergies or rhinitis.     Marland Kitchen ibuprofen (ADVIL) 800 MG tablet Take 1 tablet (800 mg total) by mouth 3 (three) times daily. 21 tablet 0   . naproxen (NAPROSYN) 500 MG tablet Take 1 tablet (500 mg total) by mouth 2 (two) times daily with a meal. 30 tablet 0   . nicotine (NICODERM CQ - DOSED IN MG/24 HOURS) 21 mg/24hr patch Place 1 patch (21 mg total) onto the skin daily. For smoking cessation 28 patch 0   . ondansetron (ZOFRAN ODT) 4 MG disintegrating tablet Take 1 tablet (4 mg total) by mouth every 8 (eight) hours as needed for nausea or vomiting. 20 tablet 0   . ondansetron (ZOFRAN) 4 MG tablet Take 1 tablet (4 mg total) by mouth every 8 (eight) hours as needed for nausea or vomiting. 10 tablet 0   . temazepam (RESTORIL) 15 MG capsule Take 1 capsule (15 mg total)  by mouth at bedtime. 15 capsule 0   . vortioxetine HBr (TRINTELLIX) 10 MG TABS tablet Take 1 tablet (10 mg total) by mouth daily. For mood 30 tablet 0     Results for orders placed or performed during the hospital encounter of 09/27/20 (from the past 48 hour(s))  Pregnancy, urine POC     Status: Abnormal   Collection Time: 09/27/20  6:00 PM  Result Value Ref Range   Preg Test, Ur POSITIVE (A) NEGATIVE    Comment:        THE SENSITIVITY OF THIS METHODOLOGY IS >24 mIU/mL   Urinalysis, Routine w reflex microscopic     Status: Abnormal   Collection Time: 09/27/20  6:01 PM  Result Value Ref Range   Color, Urine AMBER (A) YELLOW    Comment: BIOCHEMICALS MAY BE AFFECTED BY COLOR   APPearance TURBID (A) CLEAR   Specific Gravity, Urine 1.024 1.005 - 1.030   pH 5.0 5.0 - 8.0   Glucose, UA NEGATIVE NEGATIVE mg/dL   Hgb urine dipstick NEGATIVE NEGATIVE   Bilirubin Urine NEGATIVE NEGATIVE   Ketones, ur 80 (A) NEGATIVE mg/dL   Protein, ur 30 (A) NEGATIVE mg/dL   Nitrite NEGATIVE NEGATIVE   Leukocytes,Ua LARGE (A) NEGATIVE   RBC / HPF 11-20 0 - 5 RBC/hpf   WBC, UA 21-50 0 - 5 WBC/hpf    Bacteria, UA RARE (A) NONE SEEN   Squamous Epithelial / LPF 11-20 0 - 5   Mucus PRESENT     Comment: Performed at [facility], [address], [place], [place] [address]    Review of Systems  Gastrointestinal: Positive for abdominal pain.  Genitourinary: Positive for urgency and vaginal bleeding. Negative for dysuria and frequency.   Physical Exam   Blood pressure 123/75, pulse 86, temperature 98.4 F (36.9 C), resp. rate 18, height 5\' 3"  (1.6 m), weight 97.1 kg, last menstrual period 07/09/2020, SpO2 100 %.  Physical Exam Constitutional:      General: She is not in acute distress.    Appearance: She is well-developed. She is not ill-appearing, toxic-appearing or diaphoretic.  Abdominal:     Tenderness: There is generalized abdominal tenderness.  Musculoskeletal:        General: Normal range of motion.     Right upper leg: No swelling, edema, deformity, lacerations, tenderness or bony tenderness.       Legs:  Skin:    General: Skin is warm.  Neurological:     Mental Status: She is alert and oriented to person, place, and time.  Psychiatric:        Behavior: Behavior normal.    MAU Course  Procedures   Pt informed that the ultrasound is considered a limited OB ultrasound and is not intended to be a complete ultrasound exam.  Patient also informed that the ultrasound is not being completed with the intent of assessing for fetal or placental anomalies or any pelvic abnormalities.  Explained that the purpose of today's ultrasound is to assess for  viability.  Patient acknowledges the purpose of the exam and the limitations of the study.   Active fetus with + fetal heart tones    MDM  Urine culture pending  07/11/2020 renal- negative for stones Tylenol given 1 gram, pain down to 1/10; patient feels much better.   Assessment and Plan   A:  1. Sciatic pain, right   2. Right sided abdominal pain   3. [redacted] weeks gestation of pregnancy   4.  Bacterial vaginosis      P:  Discharge home in stable condition Ok to use tylenol or Ibuprofen OTC as directed on the bottle.  No ibuprofen after 28 weeks She may need PT if pain continues Return to MAU if symptoms worsen Pain improved with tylenol.   Duane Lope, NP 09/30/2020 9:05 PM

## 2020-09-27 NOTE — Discharge Instructions (Signed)
Sciatica  Sciatica is pain, weakness, tingling, or loss of feeling (numbness) along the sciatic nerve. The sciatic nerve starts in the lower back and goes down the back of each leg. Sciatica usually goes away on its own or with treatment. Sometimes, sciatica may come back (recur). What are the causes? This condition happens when the sciatic nerve is pinched or has pressure put on it. This may be the result of:  A disk in between the bones of the spine bulging out too far (herniated disk).  Changes in the spinal disks that occur with aging.  A condition that affects a muscle in the butt.  Extra bone growth near the sciatic nerve.  A break (fracture) of the area between your hip bones (pelvis).  Pregnancy.  Tumor. This is rare. What increases the risk? You are more likely to develop this condition if you:  Play sports that put pressure or stress on the spine.  Have poor strength and ease of movement (flexibility).  Have had a back injury in the past.  Have had back surgery.  Sit for long periods of time.  Do activities that involve bending or lifting over and over again.  Are very overweight (obese). What are the signs or symptoms? Symptoms can vary from mild to very bad. They may include:  Any of these problems in the lower back, leg, hip, or butt: ? Mild tingling, loss of feeling, or dull aches. ? Burning sensations. ? Sharp pains.  Loss of feeling in the back of the calf or the sole of the foot.  Leg weakness.  Very bad back pain that makes it hard to move. These symptoms may get worse when you cough, sneeze, or laugh. They may also get worse when you sit or stand for long periods of time. How is this treated? This condition often gets better without any treatment. However, treatment may include:  Changing or cutting back on physical activity when you have pain.  Doing exercises and stretching.  Putting ice or heat on the affected area.  Medicines that  help: ? To relieve pain and swelling. ? To relax your muscles.  Shots (injections) of medicines that help to relieve pain, irritation, and swelling.  Surgery. Follow these instructions at home: Medicines  Take over-the-counter and prescription medicines only as told by your doctor.  Ask your doctor if the medicine prescribed to you: ? Requires you to avoid driving or using heavy machinery. ? Can cause trouble pooping (constipation). You may need to take these steps to prevent or treat trouble pooping:  Drink enough fluids to keep your pee (urine) pale yellow.  Take over-the-counter or prescription medicines.  Eat foods that are high in fiber. These include beans, whole grains, and fresh fruits and vegetables.  Limit foods that are high in fat and sugar. These include fried or sweet foods. Managing pain      If told, put ice on the affected area. ? Put ice in a plastic bag. ? Place a towel between your skin and the bag. ? Leave the ice on for 20 minutes, 2-3 times a day.  If told, put heat on the affected area. Use the heat source that your doctor tells you to use, such as a moist heat pack or a heating pad. ? Place a towel between your skin and the heat source. ? Leave the heat on for 20-30 minutes. ? Remove the heat if your skin turns bright red. This is very important if you are   unable to feel pain, heat, or cold. You may have a greater risk of getting burned. Activity   Return to your normal activities as told by your doctor. Ask your doctor what activities are safe for you.  Avoid activities that make your symptoms worse.  Take short rests during the day. ? When you rest for a long time, do some physical activity or stretching between periods of rest. ? Avoid sitting for a long time without moving. Get up and move around at least one time each hour.  Exercise and stretch regularly, as told by your doctor.  Do not lift anything that is heavier than 10 lb (4.5 kg)  while you have symptoms of sciatica. ? Avoid lifting heavy things even when you do not have symptoms. ? Avoid lifting heavy things over and over.  When you lift objects, always lift in a way that is safe for your body. To do this, you should: ? Bend your knees. ? Keep the object close to your body. ? Avoid twisting. General instructions  Stay at a healthy weight.  Wear comfortable shoes that support your feet. Avoid wearing high heels.  Avoid sleeping on a mattress that is too soft or too hard. You might have less pain if you sleep on a mattress that is firm enough to support your back.  Keep all follow-up visits as told by your doctor. This is important. Contact a doctor if:  You have pain that: ? Wakes you up when you are sleeping. ? Gets worse when you lie down. ? Is worse than the pain you have had in the past. ? Lasts longer than 4 weeks.  You lose weight without trying. Get help right away if:  You cannot control when you pee (urinate) or poop (have a bowel movement).  You have weakness in any of these areas and it gets worse: ? Lower back. ? The area between your hip bones. ? Butt. ? Legs.  You have redness or swelling of your back.  You have a burning feeling when you pee. Summary  Sciatica is pain, weakness, tingling, or loss of feeling (numbness) along the sciatic nerve.  This condition happens when the sciatic nerve is pinched or has pressure put on it.  Sciatica can cause pain, tingling, or loss of feeling (numbness) in the lower back, legs, hips, and butt.  Treatment often includes rest, exercise, medicines, and putting ice or heat on the affected area. This information is not intended to replace advice given to you by your health care provider. Make sure you discuss any questions you have with your health care provider. Document Revised: 11/16/2018 Document Reviewed: 11/16/2018 Elsevier Patient Education  2020 [employer]  

## 2020-09-28 LAB — CULTURE, OB URINE: Culture: <10000 — AB

## 2020-09-29 LAB — GC/CHLAMYDIA PROBE AMP (~~LOC~~) NOT AT ARMC
Chlamydia: NEGATIVE
Comment: NEGATIVE
Comment: NORMAL
Neisseria Gonorrhea: NEGATIVE

## 2020-10-12 DIAGNOSIS — Z3481 Encounter for supervision of other normal pregnancy, first trimester: Secondary | ICD-10-CM | POA: Diagnosis not present

## 2020-10-12 DIAGNOSIS — Z3490 Encounter for supervision of normal pregnancy, unspecified, unspecified trimester: Secondary | ICD-10-CM | POA: Diagnosis not present

## 2020-10-12 DIAGNOSIS — R519 Headache, unspecified: Secondary | ICD-10-CM | POA: Diagnosis not present

## 2020-10-12 DIAGNOSIS — Z3A13 13 weeks gestation of pregnancy: Secondary | ICD-10-CM | POA: Diagnosis not present

## 2020-10-12 DIAGNOSIS — Z113 Encounter for screening for infections with a predominantly sexual mode of transmission: Secondary | ICD-10-CM | POA: Diagnosis not present

## 2021-01-09 ENCOUNTER — Telehealth: Payer: BLUE CROSS/BLUE SHIELD | Admitting: Physician Assistant

## 2021-01-09 DIAGNOSIS — R197 Diarrhea, unspecified: Secondary | ICD-10-CM

## 2021-01-09 NOTE — Progress Notes (Signed)
We are sorry that you are not feeling well.  Here is how we plan to help!  Based on what you have shared with me it looks like you have Acute Infectious Diarrhea.  Most cases of acute diarrhea are due to infections with virus and bacteria and are self-limited conditions lasting less than 14 days.  For your symptoms it is recommended that you stay hydrated and stick to the BRAT diet (bananas, rice (white), applesauce and toast) until your symptoms are improving. If your symptoms last more than 24-48 hours or you experience a high fever or abdominal pain you will need to be seen for an in person evaluation.   Antibiotics are not needed for most people with diarrhea.  HOME CARE  We recommend changing your diet to help with your symptoms for the next few days.  Drink plenty of fluids that contain water salt and sugar. Sports drinks such as Gatorade may help.   You may try broths, soups, bananas, applesauce, soft breads, mashed potatoes or crackers.   You are considered infectious for as long as the diarrhea continues. Hand washing or use of alcohol based hand sanitizers is recommend.  It is best to stay out of work or school until your symptoms stop.   GET HELP RIGHT AWAY  If you have dark yellow colored urine or do not pass urine frequently you should drink more fluids.    If your symptoms worsen   If you feel like you are going to pass out (faint)  You have a new problem  MAKE SURE YOU   Understand these instructions.  Will watch your condition.  Will get help right away if you are not doing well or get worse.  Your e-visit answers were reviewed by a board certified advanced clinical practitioner to complete your personal care plan.  Depending on the condition, your plan could have included both over the counter or prescription medications.  If there is a problem please reply  once you have received a response from your provider.  Your safety is important to [place].  If you have  drug allergies check your prescription carefully.    You can use MyChart to ask questions about today's visit, request a non-urgent call back, or ask for a work or school excuse for 24 hours related to this e-Visit. If it has been greater than 24 hours you will need to follow up with your provider, or enter a new e-Visit to address those concerns.   You will get an e-mail in the next two days asking about your experience.  I hope that your e-visit has been valuable and will speed your recovery. Thank you for using e-visits.  Greater than 5 minutes, yet less than 10 minutes of time have been spend researching, coordinating, and implementing care for this patient today.

## 2021-02-21 ENCOUNTER — Inpatient Hospital Stay (HOSPITAL_COMMUNITY)
Admission: AD | Admit: 2021-02-21 | Discharge: 2021-02-21 | Disposition: A | Discharge status: Home / Self Care | Payer: BLUE CROSS/BLUE SHIELD | Attending: Obstetrics & Gynecology | Admitting: Obstetrics & Gynecology

## 2021-02-21 ENCOUNTER — Other Ambulatory Visit: Payer: Self-pay

## 2021-02-21 ENCOUNTER — Encounter (HOSPITAL_COMMUNITY): Payer: Self-pay | Admitting: Obstetrics & Gynecology

## 2021-02-21 DIAGNOSIS — K219 Gastro-esophageal reflux disease without esophagitis: Secondary | ICD-10-CM | POA: Diagnosis not present

## 2021-02-21 DIAGNOSIS — Z3A32 32 weeks gestation of pregnancy: Secondary | ICD-10-CM | POA: Diagnosis not present

## 2021-02-21 DIAGNOSIS — O99613 Diseases of the digestive system complicating pregnancy, third trimester: Secondary | ICD-10-CM | POA: Diagnosis not present

## 2021-02-21 DIAGNOSIS — Z87891 Personal history of nicotine dependence: Secondary | ICD-10-CM | POA: Insufficient documentation

## 2021-02-21 DIAGNOSIS — R12 Heartburn: Secondary | ICD-10-CM | POA: Diagnosis not present

## 2021-02-21 DIAGNOSIS — R109 Unspecified abdominal pain: Secondary | ICD-10-CM | POA: Insufficient documentation

## 2021-02-21 DIAGNOSIS — O26893 Other specified pregnancy related conditions, third trimester: Secondary | ICD-10-CM

## 2021-02-21 DIAGNOSIS — O26899 Other specified pregnancy related conditions, unspecified trimester: Secondary | ICD-10-CM

## 2021-02-21 HISTORY — DX: Anxiety disorder, unspecified: F41.9

## 2021-02-21 LAB — URINALYSIS, ROUTINE W REFLEX MICROSCOPIC
Bilirubin Urine: NEGATIVE
Glucose, UA: NEGATIVE mg/dL
Hgb urine dipstick: NEGATIVE
Ketones, ur: NEGATIVE mg/dL
Leukocytes,Ua: NEGATIVE
Nitrite: NEGATIVE
Protein, ur: NEGATIVE mg/dL
Specific Gravity, Urine: 1.006 (ref 1.005–1.030)
pH: 6 (ref 5.0–8.0)

## 2021-02-21 LAB — WET PREP, GENITAL
Sperm: NONE SEEN
Trich, Wet Prep: NONE SEEN
Yeast Wet Prep HPF POC: NONE SEEN

## 2021-02-21 MED ORDER — ALUM & MAG HYDROXIDE-SIMETH 200-200-20 MG/5ML PO SUSP
30.0000 mL | Freq: Once | ORAL | Status: AC
  Administered 2021-02-21: 30 mL via ORAL
  Filled 2021-02-21: qty 30

## 2021-02-21 MED ORDER — FAMOTIDINE 20 MG PO TABS: 20.0000 mg | ORAL_TABLET | Freq: Two times a day (BID) | ORAL | 0 refills | Status: DC

## 2021-02-21 MED ORDER — LIDOCAINE VISCOUS HCL 2 % MT SOLN
15.0000 mL | Freq: Once | OROMUCOSAL | Status: AC
  Administered 2021-02-21: 15 mL via ORAL
  Filled 2021-02-21: qty 15

## 2021-02-21 MED ORDER — NIFEDIPINE 10 MG PO CAPS
10.0000 mg | ORAL_CAPSULE | ORAL | Status: AC
  Administered 2021-02-21 (×3): 10 mg via ORAL
  Filled 2021-02-21 (×4): qty 1

## 2021-02-21 NOTE — MAU Note (Signed)
Presents with c/o sharp chest pain that began on Monday reports has bilateral pain in rib cage on inspiration.  Also states has lower abdominal cramping and mid back pain.  Denies VB or LOF.  Reports no FM since Tuesday morning.

## 2021-02-21 NOTE — MAU Provider Note (Signed)
Chief Complaint:  Chest Pain, Cramping, and Back Pain   Event Date/Time   First Provider Initiated Contact with Patient 02/21/21 1913     HPI: Christina Reese is a 25 y.o. G2P0010 at [redacted]w[redacted]d who presents to maternity admissions reporting mid back pain that radiates into bilateral ribs and mid chest that is sharp and intermittent in nature that started on Monday. She reports that she has had an increase in heartburn lately. Has tried Tums without much relief. Also reporting some mild, intermittent abdominal cramping and low back pain since Monday. Took Tylenol at noon without much relief. Heating pad sometimes helps, but did not help today. Completed Flagyl for BV yesterday. She denies SOB, fever, cough, urinary s/s, contractions, vaginal bleeding, LOF, or vaginal bleeding/discharge. Endorses active fetal movement.   Pregnancy Course:   Past Medical History:  Diagnosis Date  . Anxiety   . Depression    stop meds with medication  . Scoliosis    OB History  Gravida Para Term Preterm AB Living  2       1    SAB IAB Ectopic Multiple Live Births  1            # Outcome Date GA Lbr Len/2nd Weight Sex Delivery Anes PTL Lv  2 Current           1 SAB            Past Surgical History:  Procedure Laterality Date  . ABDOMINAL SURGERY     diagnositic for suspected ectopic  . BREAST SURGERY  03/2020   Breast reduction  . TONSILLECTOMY     Family History  Problem Relation Age of Onset  . Healthy Father    Social History   Tobacco Use  . Smoking status: Former Smoker    Types: Cigarettes  . Smokeless tobacco: Never Used  . Tobacco comment: quit 1 yr before preganancy  Vaping Use  . Vaping Use: Never used  Substance Use Topics  . Alcohol use: Not Currently    Comment: occasionally  . Drug use: No   Allergies  Allergen Reactions  . Latex     Swelling in face, itching and rash  . Sulfa Antibiotics Rash   Medications Prior to Admission  Medication Sig Dispense Refill Last Dose  .  metroNIDAZOLE (FLAGYL) 500 MG tablet Take 500 mg by mouth 2 (two) times daily. Completed treatment of BV   02/20/2021 at Unknown time  . Prenatal Vit-Fe Fumarate-FA (PRENATAL VITAMIN PO) Take by mouth.   02/20/2021 at Unknown time  . Cetirizine HCl 10 MG CAPS Take 1 capsule (10 mg total) by mouth daily for 14 days. 14 capsule 0   . fexofenadine (ALLEGRA) 60 MG tablet Take 60 mg by mouth daily as needed for allergies or rhinitis.     Marland Kitchen ibuprofen (ADVIL) 800 MG tablet Take 1 tablet (800 mg total) by mouth 3 (three) times daily. 21 tablet 0   . ondansetron (ZOFRAN ODT) 4 MG disintegrating tablet Take 1 tablet (4 mg total) by mouth every 8 (eight) hours as needed for nausea or vomiting. 20 tablet 0   . ondansetron (ZOFRAN) 4 MG tablet Take 1 tablet (4 mg total) by mouth every 8 (eight) hours as needed for nausea or vomiting. 10 tablet 0    I have reviewed patient's Past Medical Hx, Surgical Hx, Family Hx, Social Hx, medications and allergies.   ROS:  Review of Systems  Constitutional: Negative.   Respiratory: Negative.   Cardiovascular:  Negative.   Gastrointestinal:       Heartburn, indigestion Lower abdominal cramping  Genitourinary: Negative.   Neurological: Negative.   Psychiatric/Behavioral: Negative.     Physical Exam   Patient Vitals for the past 24 hrs:  BP Temp Temp src Pulse Resp SpO2 Height Weight  02/21/21 2156 124/72 -- -- -- -- -- -- --  02/21/21 2133 117/63 -- -- -- -- -- -- --  02/21/21 2110 -- -- -- 88 -- -- -- --  02/21/21 2108 120/68 -- -- -- -- -- -- --  02/21/21 1841 -- -- -- -- -- 99 % -- --  02/21/21 1837 125/74 -- -- (!) 108 -- -- -- --  02/21/21 1818 128/81 98.1 F (36.7 C) Oral (!) 102 18 99 % -- --  02/21/21 1810 -- -- -- -- -- -- 5\' 3"  (1.6 m) 99.8 kg   Constitutional: well-developed, well-nourished female in no acute distress.  Cardiovascular: normal rate, regular rhythm, no murmurs Respiratory: normal effort, clear bilaterally GI: abd soft, non-tender,  gravid  MS: extremities nontender, no edema, normal ROM Neurologic: alert and oriented x 4.  GU: neg CVAT. Cervix: closed/thick    FHT: Baseline 140, moderate variability, accelerations present, no decelerations Contractions: ui   Labs: Results for orders placed or performed during the hospital encounter of 02/21/21 (from the past 24 hour(s))  Urinalysis, Routine w reflex microscopic     Status: Abnormal   Collection Time: 02/21/21  7:39 PM  Result Value Ref Range   Color, Urine YELLOW YELLOW   APPearance HAZY (A) CLEAR   Specific Gravity, Urine 1.006 1.005 - 1.030   pH 6.0 5.0 - 8.0   Glucose, UA NEGATIVE NEGATIVE mg/dL   Hgb urine dipstick NEGATIVE NEGATIVE   Bilirubin Urine NEGATIVE NEGATIVE   Ketones, ur NEGATIVE NEGATIVE mg/dL   Protein, ur NEGATIVE NEGATIVE mg/dL   Nitrite NEGATIVE NEGATIVE   Leukocytes,Ua NEGATIVE NEGATIVE  Wet prep, genital     Status: Abnormal   Collection Time: 02/21/21  7:39 PM  Result Value Ref Range   Yeast Wet Prep HPF POC NONE SEEN NONE SEEN   Trich, Wet Prep NONE SEEN NONE SEEN   Clue Cells Wet Prep HPF POC PRESENT (A) NONE SEEN   WBC, Wet Prep HPF POC MANY (A) NONE SEEN   Sperm NONE SEEN     Imaging:  No results found.  MAU Course: Orders Placed This Encounter  Procedures  . Wet prep, genital  . Urinalysis, Routine w reflex microscopic  . ED EKG  . Discharge patient   Meds ordered this encounter  Medications  . AND Linked Order Group   . alum & mag hydroxide-simeth (MAALOX/MYLANTA) 200-200-20 MG/5ML suspension 30 mL   . lidocaine (XYLOCAINE) 2 % viscous mouth solution 15 mL  . NIFEdipine (PROCARDIA) capsule 10 mg  . famotidine (PEPCID) 20 MG tablet    Sig: Take 1 tablet (20 mg total) by mouth 2 (two) times daily.    Dispense:  30 tablet    Refill:  0    Order Specific Question:   Supervising Provider    Answer:   05-20-2001    MDM: UA unremarkable EKG sinus tachycardia otherwise normal VSS, O2 sat 98-99% Wet  prep +clue cells. Pt reports completed medication for BV yesterday.  GC/CT GI cocktail given with improvement in symptoms Cervix closed/thick PO hydration and Procardia 10mg  q20 mins x4 doses ordered UI resolved with PO hydration and Procardia x3 doses NST reactive  and reassuring for gestational age Low suspicion for preterm labor at this time.  Low suspicion for cardiac event. Suspect GERD given improvement in symptoms with GI cocktail, EKG results, and stable VSS. Recommend Pepcid BID, keep food diary to help identify triggers, encouraged to increase PO hydration and water intake Reviewed warning signs/symptoms. Keep prenatal appointment as scheduled.   Assessment: 1. Heartburn during pregnancy in third trimester   2. [redacted] weeks gestation of pregnancy   3. Abdominal pain affecting pregnancy     Plan: Discharge home in stable condition  Preterm labor precautions and fetal kick counts Keep prenatal appointment as scheduled   Allergies as of 02/21/2021      Reactions   Latex    Swelling in face, itching and rash   Sulfa Antibiotics Rash      Medication List    STOP taking these medications   ibuprofen 800 MG tablet Commonly known as: ADVIL   metroNIDAZOLE 500 MG tablet Commonly known as: FLAGYL     TAKE these medications   Cetirizine HCl 10 MG Caps Take 1 capsule (10 mg total) by mouth daily for 14 days.   famotidine 20 MG tablet Commonly known as: PEPCID Take 1 tablet (20 mg total) by mouth 2 (two) times daily.   fexofenadine 60 MG tablet Commonly known as: ALLEGRA Take 60 mg by mouth daily as needed for allergies or rhinitis.   ondansetron 4 MG disintegrating tablet Commonly known as: Zofran ODT Take 1 tablet (4 mg total) by mouth every 8 (eight) hours as needed for nausea or vomiting.   ondansetron 4 MG tablet Commonly known as: Zofran Take 1 tablet (4 mg total) by mouth every 8 (eight) hours as needed for nausea or vomiting.   PRENATAL VITAMIN PO Take by  mouth.       Camelia Eng, CNM 02/21/2021 7:25 PM

## 2021-02-21 NOTE — MAU Note (Signed)
Chest pain started on Monday.  Sharp pain, does not last long.  Comes and goes.  Experienced it twice today.

## 2021-02-21 NOTE — Discharge Instructions (Signed)
Heartburn During Pregnancy Heartburn is a type of pain or discomfort that can happen in the throat or chest. It is often described as a burning sensation. Heartburn is common during pregnancy because:  Progesterone, a hormone that is released during pregnancy, may relax the valve that separates the esophagus from the stomach (lower esophageal sphincter, or LES). This allows stomach acid to move up into the esophagus, causing heartburn.  The uterus gets larger and pushes up on the stomach, which pushes more acid into the esophagus. This is especially true in the later stages of pregnancy. Heartburn usually goes away or gets better after giving birth. What are the causes? This condition is caused by stomach acid backing up into the esophagus (reflux). Reflux can be triggered by:  Changing hormone levels.  Large meals.  Certain foods and beverages, such as coffee, chocolate, onions, and peppermint.  Exercise.  Increased stomach acid production. What increases the risk? You are more likely to develop this condition if:  You had heartburn prior to becoming pregnant.  You have been pregnant more than once before.  You are overweight or obese. The likelihood that you will get heartburn also increases as you get further along in your pregnancy, especially during the last trimester. What are the signs or symptoms? Symptoms of this condition include:  Burning pain in the chest or lower throat.  A bitter taste in the mouth.  Coughing.  Problems swallowing.  Vomiting.  A hoarse voice.  Asthma. Symptoms may get worse when you lie down or bend over. Symptoms are often worse at night. How is this diagnosed? This condition is diagnosed based on:  Your medical history.  Your symptoms.  Blood tests to check for a certain type of bacteria that is associated with heartburn.  Whether taking heartburn medicine relieves your symptoms.  An examination of the stomach and esophagus  using a tube that has a light and camera (endoscopy). How is this treated? Treatment for this condition depends on how severe your symptoms are. Your health care provider may recommend:  Over-the-counter medicines for mild heartburn, such as antacids or acid reducers.  Prescription medicines to decrease stomach acid or to protect your stomach lining.  Certain changes in your diet.  Raising the head of your bed so it is higher than the foot of the bed. This helps prevent stomach acid from backing up into the esophagus when you are lying down. Follow these instructions at home: Eating and drinking  Do not drink alcohol during your pregnancy.  Identify foods and beverages that make your symptoms worse and avoid them.  Eat small, frequent meals instead of large meals.  Avoid drinking large amounts of liquid with your meals.  Avoid eating meals during the 2-3 hours before bedtime.  Avoid lying down right after you eat.  Do not exercise right after you eat. Beverages to avoid  Coffee and tea, with or without caffeine.  Energy drinks and sports drinks.  Carbonated drinks or sodas.  Citrus fruit juices. Foods to avoid  Chocolate and cocoa.  Peppermint and mint flavorings.  Garlic, onions, and horseradish.  Spicy and acidic foods, including peppers, chili powder, curry powder, vinegar, hot sauces, and barbecue sauce.  Citrus fruits, such as oranges, lemons, and limes.  Tomato-based foods, such as red sauce, chili, and salsa.  Fried and fatty foods, such as donuts, french fries, potato chips, and high-fat dressings.  High-fat meats, such as hot dogs, precooked or cured meat, sausage, ham, and bacon.    High-fat dairy items, such as whole milk, butter, and cheese. Medicines T Preterm Labor The normal length of a pregnancy is 39-41 weeks. Preterm labor is when labor starts before 37 completed weeks of pregnancy. Babies who are born prematurely and survive may not be fully  developed and may be at an increased risk for long-term problems such as cerebral palsy, developmental delays, and vision and hearing problems. Babies who are born too early may have problems soon after birth. Problems may include regulating blood sugar, body temperature, heart rate, and breathing rate. These babies often have trouble with feeding. The risk of having problems is highest for babies who are born before 34 weeks of pregnancy. What are the causes? The exact cause of this condition is not known. What increases the risk? You are more likely to have preterm labor if you have certain risk factors that relate to your medical history, problems with present and past pregnancies, and lifestyle factors. Medical history You have abnormalities of the uterus, including a short cervix. You have STIs (sexually transmitted infections), or other infections of the urinary tract and the vagina. You have chronic illnesses, such as blood clotting problems, diabetes, or high blood pressure. You are overweight or underweight. Present and past pregnancies You have had preterm labor before. You are pregnant with twins or other multiples. You have been diagnosed with a condition in which the placenta covers your cervix (placenta previa). You waited less than 6 months between giving birth and becoming pregnant again. Your unborn baby has some abnormalities. You have vaginal bleeding during pregnancy. You became pregnant through in vitro fertilization (IVF). Lifestyle and environmental factors You use tobacco products. You drink alcohol. You use street drugs. You have stress and no social support. You experience domestic violence. You are exposed to certain chemicals or environmental pollutants. Other factors You are younger than age 28 or older than age 43. What are the signs or symptoms? Symptoms of this condition include: Cramps similar to those that can happen during a menstrual period. The  cramps may happen with diarrhea. Pain in the abdomen or lower back. Regular contractions that may feel like tightening of the abdomen. A feeling of increased pressure in the pelvis. Increased watery or bloody mucus discharge from the vagina. Water breaking (ruptured amniotic sac). How is this diagnosed? This condition is diagnosed based on: Your medical history and a physical exam. A pelvic exam. An ultrasound. Monitoring your uterus for contractions. Other tests, including: A swab of the cervix to check for a chemical called fetal fibronectin. Urine tests. How is this treated? Treatment for this condition depends on the length of your pregnancy, your condition, and the health of your baby. Treatment may include: Taking medicines, such as: Hormone medicines. These may be given early in pregnancy to help support the pregnancy. Medicines to stop contractions. Medicines to help mature the baby's lungs. These may be prescribed if the risk of delivery is high. Medicines to prevent your baby from developing cerebral palsy. Bed rest. If the labor happens before 34 weeks of pregnancy, you may need to stay in the hospital. Delivery of the baby. Follow these instructions at home: Do not use any products that contain nicotine or tobacco, such as cigarettes, e-cigarettes, and chewing tobacco. If you need help quitting, ask your health care provider. Do not drink alcohol. Take over-the-counter and prescription medicines only as told by your health care provider. Rest as told by your health care provider. Return to your normal activities as  told by your health care provider. Ask your health care provider what activities are safe for you. Keep all follow-up visits as told by your health care provider. This is important.   How is this prevented? To increase your chance of having a full-term pregnancy: Do not use street drugs or medicines that have not been prescribed to you during your  pregnancy. Talk with your health care provider before taking any herbal supplements, even if you have been taking them regularly. Make sure you gain a healthy amount of weight during your pregnancy. Watch for infection. If you think that you might have an infection, get it checked right away. Symptoms of infection may include: Fever. Abnormal vaginal discharge or discharge that smells bad. Pain or burning with urination. Needing to urinate urgently. Frequently urinating or passing small amounts of urine frequently. Blood in your urine. Urine that smells bad or unusual. Tell your health care provider if you have had preterm labor before. Contact a health care provider if: You think you are going into preterm labor. You have signs or symptoms of preterm labor. You have symptoms of infection. Get help right away if: You are having regular, painful contractions every 5 minutes or less. Your water breaks. Summary Preterm labor is labor that starts before you reach 37 weeks of pregnancy. Delivering your baby early increases your baby's risk of developing lifelong problems. The exact cause of preterm labor is unknown. However, having an abnormal uterus, an STI (sexually transmitted infection), or vaginal bleeding during pregnancy increases your risk for preterm labor. Keep all follow-up visits as told by your health care provider. This is important. Contact a health care provider if you have signs or symptoms of preterm labor. This information is not intended to replace advice given to you by your health care provider. Make sure you discuss any questions you have with your health care provider. Document Revised: 11/30/2019 Document Reviewed: 11/30/2019 Elsevier Patient Education  2021 [employer]  ake over-the-counter and prescription medicines only as told by your health care provider.  Do not take aspirin or NSAIDs, such as ibuprofen, unless your health care provider tells you to take  them.  You may be instructed to avoid medicines that contain sodium bicarbonate. General instructions  If directed, raise the head of your bed about 6 inches (15 cm) by putting blocks under the legs. Sleeping with more pillows does not effectively relieve heartburn because it only changes the position of your head.  Do not use any products that contain nicotine or tobacco, such as cigarettes, e-cigarettes, and chewing tobacco. If you need help quitting, ask your health care provider.  Wear loose-fitting clothing.  Try to reduce your stress, such as with yoga or meditation. If you need help managing stress, ask your health care provider.  Maintain a healthy weight. If you are overweight, work with your health care provider to safely manage your weight.  Keep all follow-up visits as told by your health care provider. This is important. Where to find more information  American Pregnancy Association: americanpregnancy.org Contact a health care provider if:  Your symptoms do not improve with treatment, or you develop new symptoms.  You have unexplained weight loss.  You have difficulty swallowing.  You make loud sounds when you breathe (wheeze).  You have a cough that does not go away.  You have frequent heartburn for more than 2 weeks.  You have nausea or vomiting that does not get better with treatment.  You have pain  in your abdomen. Get help right away if:  You have severe chest pain that spreads to your arm, neck, or jaw.  You feel sweaty, dizzy, or light-headed.  You have shortness of breath.  You have pain when swallowing.  You vomit, and your vomit looks like blood or coffee grounds.  Your stool is bloody or black. Summary  Heartburn in pregnancy is common, especially during the last trimester.  This condition is caused by stomach acid backing up into the esophagus (reflux).  This condition can be treated with medicines, changes to your diet, or elevating the  head of your bed.  Keep all follow-up visits as told by your health care provider. This is important. This information is not intended to replace advice given to you by your health care provider. Make sure you discuss any questions you have with your health care provider. Document Revised: 07/21/2019 Document Reviewed: 07/21/2019 Elsevier Patient Education  2021 [employer].

## 2021-02-22 LAB — GC/CHLAMYDIA PROBE AMP (~~LOC~~) NOT AT ARMC
Chlamydia: NEGATIVE
Comment: NEGATIVE
Comment: NORMAL
Neisseria Gonorrhea: NEGATIVE

## 2021-04-03 ENCOUNTER — Other Ambulatory Visit: Payer: Self-pay | Admitting: Obstetrics and Gynecology

## 2021-04-10 ENCOUNTER — Encounter (HOSPITAL_COMMUNITY): Payer: Self-pay | Admitting: Anesthesiology

## 2021-04-10 ENCOUNTER — Encounter (HOSPITAL_COMMUNITY)
Admission: RE | Admit: 2021-04-10 | Discharge: 2021-04-10 | Disposition: A | Discharge status: Home / Self Care | Payer: BLUE CROSS/BLUE SHIELD | Source: Ambulatory Visit | Attending: Obstetrics and Gynecology | Admitting: Obstetrics and Gynecology

## 2021-04-10 ENCOUNTER — Other Ambulatory Visit: Payer: Self-pay

## 2021-04-10 ENCOUNTER — Encounter (HOSPITAL_COMMUNITY): Payer: Self-pay

## 2021-04-10 ENCOUNTER — Other Ambulatory Visit (HOSPITAL_COMMUNITY)
Admission: RE | Admit: 2021-04-10 | Discharge: 2021-04-10 | Disposition: A | Discharge status: Home / Self Care | Payer: BLUE CROSS/BLUE SHIELD | Source: Ambulatory Visit | Attending: Obstetrics and Gynecology | Admitting: Obstetrics and Gynecology

## 2021-04-10 DIAGNOSIS — Z01812 Encounter for preprocedural laboratory examination: Secondary | ICD-10-CM | POA: Insufficient documentation

## 2021-04-10 DIAGNOSIS — Z20822 Contact with and (suspected) exposure to covid-19: Secondary | ICD-10-CM | POA: Insufficient documentation

## 2021-04-10 LAB — CBC
HCT: 34.6 % — ABNORMAL LOW (ref 36.0–46.0)
Hemoglobin: 11.3 g/dL — ABNORMAL LOW (ref 12.0–15.0)
MCH: 28.1 pg (ref 26.0–34.0)
MCHC: 32.7 g/dL (ref 30.0–36.0)
MCV: 86.1 fL (ref 80.0–100.0)
Platelets: 227 10*3/uL (ref 150–400)
RBC: 4.02 MIL/uL (ref 3.87–5.11)
RDW: 15 % (ref 11.5–15.5)
WBC: 7.6 10*3/uL (ref 4.0–10.5)
nRBC: 0 % (ref 0.0–0.2)

## 2021-04-10 LAB — TYPE AND SCREEN
ABO/RH(D): O POS
Antibody Screen: NEGATIVE

## 2021-04-10 LAB — SARS CORONAVIRUS 2 (TAT 6-24 HRS): SARS Coronavirus 2: NEGATIVE

## 2021-04-10 NOTE — Anesthesia Preprocedure Evaluation (Deleted)
Anesthesia Evaluation    Airway        Dental   Pulmonary former smoker,          Cardiovascular     Neuro/Psych    GI/Hepatic   Endo/Other    Renal/GU      Musculoskeletal   Abdominal   Peds  Hematology   Anesthesia Other Findings   Reproductive/Obstetrics                             Anesthesia Physical Anesthesia Plan Anesthesia Quick Evaluation  

## 2021-04-10 NOTE — Patient Instructions (Signed)
Christina Reese  04/10/2021   Your procedure is scheduled on: 04/11/2021  Arrive at 1230 at Entrance C on [address] at [facility]  and [employer]. You are invited to use the FREE valet parking or use the Visitor's parking deck.  Pick up the phone at the desk and dial [phone].  Call this number if you have problems the morning of surgery: [phone]  Remember:   Do not eat food:(After Midnight) Desps de medianoche.  Do not drink clear liquids: (After Midnight) Desps de medianoche.  Take these medicines the morning of surgery with A SIP OF WATER:  none   Do not wear jewelry, make-up or nail polish.  Do not wear lotions, powders, or perfumes. Do not wear deodorant.  Do not shave 48 hours prior to surgery.  Do not bring valuables to the hospital.  [facility] is not   responsible for any belongings or valuables brought to the hospital.  Contacts, dentures or bridgework may not be worn into surgery.  Leave suitcase in the car. After surgery it may be brought to your room.  For patients admitted to the hospital, checkout time is 11:00 AM the day of              discharge.      Please read over the following fact sheets that you were given:     Preparing for Surgery

## 2021-04-11 ENCOUNTER — Other Ambulatory Visit: Payer: Self-pay

## 2021-04-11 ENCOUNTER — Inpatient Hospital Stay (HOSPITAL_COMMUNITY): Payer: BLUE CROSS/BLUE SHIELD | Admitting: Anesthesiology

## 2021-04-11 ENCOUNTER — Encounter (HOSPITAL_COMMUNITY): Payer: Self-pay | Admitting: Obstetrics and Gynecology

## 2021-04-11 ENCOUNTER — Encounter (HOSPITAL_COMMUNITY): Admission: RE | Payer: Self-pay | Source: Home / Self Care

## 2021-04-11 ENCOUNTER — Inpatient Hospital Stay (HOSPITAL_COMMUNITY)
Admission: RE | Admit: 2021-04-11 | Payer: BLUE CROSS/BLUE SHIELD | Source: Home / Self Care | Admitting: Obstetrics and Gynecology

## 2021-04-11 ENCOUNTER — Encounter (HOSPITAL_COMMUNITY)
Admission: AD | Disposition: A | Discharge status: Home / Self Care | Payer: Self-pay | Source: Home / Self Care | Attending: Obstetrics and Gynecology

## 2021-04-11 ENCOUNTER — Inpatient Hospital Stay (HOSPITAL_COMMUNITY)
Admission: AD | Admit: 2021-04-11 | Discharge: 2021-04-13 | DRG: 787 | Disposition: A | Discharge status: Home / Self Care | Payer: BLUE CROSS/BLUE SHIELD | Attending: Obstetrics and Gynecology | Admitting: Obstetrics and Gynecology

## 2021-04-11 DIAGNOSIS — Z3A39 39 weeks gestation of pregnancy: Secondary | ICD-10-CM

## 2021-04-11 DIAGNOSIS — O9902 Anemia complicating childbirth: Secondary | ICD-10-CM | POA: Diagnosis present

## 2021-04-11 DIAGNOSIS — O9832 Other infections with a predominantly sexual mode of transmission complicating childbirth: Secondary | ICD-10-CM | POA: Diagnosis present

## 2021-04-11 DIAGNOSIS — Z20822 Contact with and (suspected) exposure to covid-19: Secondary | ICD-10-CM | POA: Diagnosis present

## 2021-04-11 DIAGNOSIS — M419 Scoliosis, unspecified: Secondary | ICD-10-CM | POA: Diagnosis present

## 2021-04-11 DIAGNOSIS — Z8669 Personal history of other diseases of the nervous system and sense organs: Secondary | ICD-10-CM

## 2021-04-11 DIAGNOSIS — O99892 Other specified diseases and conditions complicating childbirth: Secondary | ICD-10-CM | POA: Diagnosis present

## 2021-04-11 DIAGNOSIS — Z87891 Personal history of nicotine dependence: Secondary | ICD-10-CM

## 2021-04-11 DIAGNOSIS — D509 Iron deficiency anemia, unspecified: Secondary | ICD-10-CM | POA: Diagnosis not present

## 2021-04-11 DIAGNOSIS — A630 Anogenital (venereal) warts: Secondary | ICD-10-CM | POA: Diagnosis present

## 2021-04-11 DIAGNOSIS — Z8759 Personal history of other complications of pregnancy, childbirth and the puerperium: Secondary | ICD-10-CM

## 2021-04-11 DIAGNOSIS — O26893 Other specified pregnancy related conditions, third trimester: Secondary | ICD-10-CM | POA: Diagnosis present

## 2021-04-11 DIAGNOSIS — Z8739 Personal history of other diseases of the musculoskeletal system and connective tissue: Secondary | ICD-10-CM

## 2021-04-11 LAB — RPR: RPR Ser Ql: NONREACTIVE

## 2021-04-11 LAB — BASIC METABOLIC PANEL
Anion gap: 10 (ref 5–15)
BUN: <5 mg/dL — ABNORMAL LOW (ref 6–20)
CO2: 17 mmol/L — ABNORMAL LOW (ref 22–32)
Calcium: 8.7 mg/dL — ABNORMAL LOW (ref 8.9–10.3)
Chloride: 109 mmol/L (ref 98–111)
Creatinine, Ser: 0.54 mg/dL (ref 0.44–1.00)
GFR, Estimated: >60 mL/min (ref >60)
Glucose, Bld: 67 mg/dL — ABNORMAL LOW (ref 70–99)
Potassium: 3.4 mmol/L — ABNORMAL LOW (ref 3.5–5.1)
Sodium: 136 mmol/L (ref 135–145)

## 2021-04-11 SURGERY — Surgical Case
Anesthesia: Regional

## 2021-04-11 SURGERY — Surgical Case
Anesthesia: Spinal

## 2021-04-11 MED ORDER — IBUPROFEN 600 MG PO TABS: 600.0000 mg | ORAL_TABLET | Freq: Three times a day (TID) | ORAL | Status: DC

## 2021-04-11 MED ORDER — CEFAZOLIN SODIUM-DEXTROSE 2-4 GM/100ML-% IV SOLN
INTRAVENOUS | Status: AC
  Filled 2021-04-11: qty 100

## 2021-04-11 MED ORDER — IBUPROFEN 600 MG PO TABS
600.0000 mg | ORAL_TABLET | Freq: Four times a day (QID) | ORAL | Status: DC
  Administered 2021-04-12 – 2021-04-13 (×4): 600 mg via ORAL
  Filled 2021-04-11 (×5): qty 1

## 2021-04-11 MED ORDER — OXYTOCIN-SODIUM CHLORIDE 30-0.9 UT/500ML-% IV SOLN
INTRAVENOUS | Status: AC
  Filled 2021-04-11: qty 500

## 2021-04-11 MED ORDER — SODIUM CHLORIDE 0.9 % IR SOLN
Status: DC | PRN
  Administered 2021-04-11: 1

## 2021-04-11 MED ORDER — SIMETHICONE 80 MG PO CHEW
80.0000 mg | CHEWABLE_TABLET | Freq: Three times a day (TID) | ORAL | Status: DC
  Administered 2021-04-12 – 2021-04-13 (×5): 80 mg via ORAL
  Filled 2021-04-11 (×5): qty 1

## 2021-04-11 MED ORDER — MORPHINE SULFATE (PF) 0.5 MG/ML IJ SOLN
INTRAMUSCULAR | Status: AC
  Filled 2021-04-11: qty 10

## 2021-04-11 MED ORDER — MEPERIDINE HCL 25 MG/ML IJ SOLN: 6.2500 mg | INTRAMUSCULAR | Status: DC | PRN

## 2021-04-11 MED ORDER — KETOROLAC TROMETHAMINE 30 MG/ML IJ SOLN: 30.0000 mg | Freq: Four times a day (QID) | INTRAMUSCULAR | Status: AC | PRN

## 2021-04-11 MED ORDER — DIPHENHYDRAMINE HCL 50 MG/ML IJ SOLN: 12.5000 mg | INTRAMUSCULAR | Status: DC | PRN

## 2021-04-11 MED ORDER — KETOROLAC TROMETHAMINE 30 MG/ML IJ SOLN
INTRAMUSCULAR | Status: AC
  Filled 2021-04-11: qty 1

## 2021-04-11 MED ORDER — DEXAMETHASONE SODIUM PHOSPHATE 10 MG/ML IJ SOLN
INTRAMUSCULAR | Status: DC | PRN
  Administered 2021-04-11: 10 mg via INTRAVENOUS

## 2021-04-11 MED ORDER — MENTHOL 3 MG MT LOZG: 1.0000 | LOZENGE | OROMUCOSAL | Status: DC | PRN

## 2021-04-11 MED ORDER — ACETAMINOPHEN 10 MG/ML IV SOLN
INTRAVENOUS | Status: AC
  Filled 2021-04-11: qty 100

## 2021-04-11 MED ORDER — NALBUPHINE HCL 10 MG/ML IJ SOLN: 5.0000 mg | Freq: Once | INTRAMUSCULAR | Status: DC | PRN

## 2021-04-11 MED ORDER — MORPHINE SULFATE (PF) 0.5 MG/ML IJ SOLN
INTRAMUSCULAR | Status: DC | PRN
  Administered 2021-04-11: 150 ug via INTRATHECAL

## 2021-04-11 MED ORDER — OXYTOCIN-SODIUM CHLORIDE 30-0.9 UT/500ML-% IV SOLN
INTRAVENOUS | Status: DC | PRN
  Administered 2021-04-11: 300 mL via INTRAVENOUS

## 2021-04-11 MED ORDER — ONDANSETRON HCL 4 MG/2ML IJ SOLN: 4.0000 mg | Freq: Three times a day (TID) | INTRAMUSCULAR | Status: DC | PRN

## 2021-04-11 MED ORDER — NALOXONE HCL 4 MG/10ML IJ SOLN
1.0000 ug/kg/h | INTRAVENOUS | Status: DC | PRN
  Filled 2021-04-11: qty 5

## 2021-04-11 MED ORDER — NALBUPHINE HCL 10 MG/ML IJ SOLN
5.0000 mg | INTRAMUSCULAR | Status: DC | PRN
Start: 2021-04-11 — End: 2021-04-13
  Administered 2021-04-12: 5 mg via INTRAVENOUS

## 2021-04-11 MED ORDER — PROMETHAZINE HCL 25 MG/ML IJ SOLN: 6.2500 mg | INTRAMUSCULAR | Status: DC | PRN

## 2021-04-11 MED ORDER — PHENYLEPHRINE HCL-NACL 20-0.9 MG/250ML-% IV SOLN
INTRAVENOUS | Status: AC
  Filled 2021-04-11: qty 250

## 2021-04-11 MED ORDER — DIPHENHYDRAMINE HCL 25 MG PO CAPS: 25.0000 mg | ORAL_CAPSULE | Freq: Four times a day (QID) | ORAL | Status: DC | PRN

## 2021-04-11 MED ORDER — ONDANSETRON HCL 4 MG/2ML IJ SOLN
INTRAMUSCULAR | Status: DC | PRN
  Administered 2021-04-11: 4 mg via INTRAVENOUS

## 2021-04-11 MED ORDER — NALBUPHINE HCL 10 MG/ML IJ SOLN
5.0000 mg | Freq: Once | INTRAMUSCULAR | Status: DC | PRN
  Filled 2021-04-11: qty 1

## 2021-04-11 MED ORDER — LACTATED RINGERS IV SOLN: INTRAVENOUS | Status: DC

## 2021-04-11 MED ORDER — NALOXONE HCL 0.4 MG/ML IJ SOLN: 0.4000 mg | INTRAMUSCULAR | Status: DC | PRN

## 2021-04-11 MED ORDER — SODIUM CHLORIDE 0.9% FLUSH: 3.0000 mL | INTRAVENOUS | Status: DC | PRN

## 2021-04-11 MED ORDER — HYDROMORPHONE HCL 1 MG/ML IJ SOLN: 0.2500 mg | INTRAMUSCULAR | Status: DC | PRN

## 2021-04-11 MED ORDER — PRENATAL MULTIVITAMIN CH
1.0000 | ORAL_TABLET | Freq: Every day | ORAL | Status: DC
  Administered 2021-04-12 – 2021-04-13 (×2): 1 via ORAL
  Filled 2021-04-11 (×2): qty 1

## 2021-04-11 MED ORDER — ACETAMINOPHEN 10 MG/ML IV SOLN: 1000.0000 mg | Freq: Once | INTRAVENOUS | Status: DC | PRN

## 2021-04-11 MED ORDER — FENTANYL CITRATE (PF) 100 MCG/2ML IJ SOLN
INTRAMUSCULAR | Status: AC
  Filled 2021-04-11: qty 2

## 2021-04-11 MED ORDER — SCOPOLAMINE 1 MG/3DAYS TD PT72
MEDICATED_PATCH | TRANSDERMAL | Status: AC
  Filled 2021-04-11: qty 1

## 2021-04-11 MED ORDER — OXYCODONE HCL 5 MG PO TABS: 5.0000 mg | ORAL_TABLET | Freq: Once | ORAL | Status: DC | PRN

## 2021-04-11 MED ORDER — BUPIVACAINE IN DEXTROSE 0.75-8.25 % IT SOLN
INTRATHECAL | Status: DC | PRN
  Administered 2021-04-11: 1.6 mL via INTRATHECAL

## 2021-04-11 MED ORDER — KETOROLAC TROMETHAMINE 30 MG/ML IJ SOLN
30.0000 mg | Freq: Four times a day (QID) | INTRAMUSCULAR | Status: AC | PRN
  Administered 2021-04-11 – 2021-04-12 (×4): 30 mg via INTRAVENOUS
  Filled 2021-04-11 (×3): qty 1

## 2021-04-11 MED ORDER — TETANUS-DIPHTH-ACELL PERTUSSIS 5-2.5-18.5 LF-MCG/0.5 IM SUSY: 0.5000 mL | PREFILLED_SYRINGE | Freq: Once | INTRAMUSCULAR | Status: DC

## 2021-04-11 MED ORDER — SIMETHICONE 80 MG PO CHEW
80.0000 mg | CHEWABLE_TABLET | ORAL | Status: DC | PRN
  Administered 2021-04-13: 80 mg via ORAL
  Filled 2021-04-11: qty 1

## 2021-04-11 MED ORDER — SCOPOLAMINE 1 MG/3DAYS TD PT72
1.0000 | MEDICATED_PATCH | Freq: Once | TRANSDERMAL | Status: DC
  Administered 2021-04-11: 1.5 mg via TRANSDERMAL

## 2021-04-11 MED ORDER — OXYCODONE HCL 5 MG/5ML PO SOLN
5.0000 mg | Freq: Once | ORAL | Status: DC | PRN
  Filled 2021-04-11: qty 5

## 2021-04-11 MED ORDER — CEFAZOLIN SODIUM-DEXTROSE 2-4 GM/100ML-% IV SOLN: 2.0000 g | INTRAVENOUS | Status: DC

## 2021-04-11 MED ORDER — OXYCODONE HCL 5 MG PO TABS: 5.0000 mg | ORAL_TABLET | ORAL | Status: DC | PRN

## 2021-04-11 MED ORDER — COCONUT OIL OIL: 1.0000 "application " | TOPICAL_OIL | Status: DC | PRN

## 2021-04-11 MED ORDER — WITCH HAZEL-GLYCERIN EX PADS: 1.0000 "application " | MEDICATED_PAD | CUTANEOUS | Status: DC | PRN

## 2021-04-11 MED ORDER — PHENYLEPHRINE HCL-NACL 20-0.9 MG/250ML-% IV SOLN
INTRAVENOUS | Status: DC | PRN
  Administered 2021-04-11: 60 ug/min via INTRAVENOUS

## 2021-04-11 MED ORDER — OXYTOCIN-SODIUM CHLORIDE 30-0.9 UT/500ML-% IV SOLN: 2.5000 [IU]/h | INTRAVENOUS | Status: AC

## 2021-04-11 MED ORDER — SENNOSIDES-DOCUSATE SODIUM 8.6-50 MG PO TABS
2.0000 | ORAL_TABLET | Freq: Every day | ORAL | Status: DC
  Administered 2021-04-12 – 2021-04-13 (×2): 2 via ORAL
  Filled 2021-04-11 (×2): qty 2

## 2021-04-11 MED ORDER — ACETAMINOPHEN 10 MG/ML IV SOLN
INTRAVENOUS | Status: DC | PRN
  Administered 2021-04-11: 1000 mg via INTRAVENOUS

## 2021-04-11 MED ORDER — LACTATED RINGERS IV SOLN: INTRAVENOUS | Status: DC | PRN

## 2021-04-11 MED ORDER — FENTANYL CITRATE (PF) 100 MCG/2ML IJ SOLN
INTRAMUSCULAR | Status: DC | PRN
  Administered 2021-04-11: 15 ug via INTRATHECAL

## 2021-04-11 MED ORDER — CEFAZOLIN SODIUM-DEXTROSE 2-4 GM/100ML-% IV SOLN
2.0000 g | INTRAVENOUS | Status: AC
  Administered 2021-04-11: 2 g via INTRAVENOUS

## 2021-04-11 MED ORDER — POVIDONE-IODINE 10 % EX SWAB
2.0000 "application " | Freq: Once | CUTANEOUS | Status: AC
  Administered 2021-04-11: 2 via TOPICAL

## 2021-04-11 MED ORDER — DIPHENHYDRAMINE HCL 25 MG PO CAPS: 25.0000 mg | ORAL_CAPSULE | ORAL | Status: DC | PRN

## 2021-04-11 MED ORDER — DIBUCAINE (PERIANAL) 1 % EX OINT: 1.0000 "application " | TOPICAL_OINTMENT | CUTANEOUS | Status: DC | PRN

## 2021-04-11 MED ORDER — NALBUPHINE HCL 10 MG/ML IJ SOLN: 5.0000 mg | INTRAMUSCULAR | Status: DC | PRN

## 2021-04-11 SURGICAL SUPPLY — 37 items
BENZOIN TINCTURE PRP APPL 2/3 (GAUZE/BANDAGES/DRESSINGS) ×2 IMPLANT
CHLORAPREP W/TINT 26ML (MISCELLANEOUS) ×2 IMPLANT
CLAMP CORD UMBIL (MISCELLANEOUS) IMPLANT
CLOSURE STERI STRIP 1/2 X4 (GAUZE/BANDAGES/DRESSINGS) ×2 IMPLANT
CLOTH BEACON ORANGE TIMEOUT ST (SAFETY) ×2 IMPLANT
DRAIN JACKSON PRT FLT 10 (DRAIN) IMPLANT
DRSG OPSITE POSTOP 4X10 (GAUZE/BANDAGES/DRESSINGS) ×2 IMPLANT
ELECT REM PT RETURN 9FT ADLT (ELECTROSURGICAL) ×2
ELECTRODE REM PT RTRN 9FT ADLT (ELECTROSURGICAL) ×1 IMPLANT
EVACUATOR SILICONE 100CC (DRAIN) IMPLANT
EXTRACTOR VACUUM M CUP 4 TUBE (SUCTIONS) IMPLANT
GLOVE BIO SURGEON STRL SZ 6.5 (GLOVE) ×2 IMPLANT
GLOVE BIOGEL PI IND STRL 7.0 (GLOVE) ×2 IMPLANT
GLOVE BIOGEL PI INDICATOR 7.0 (GLOVE) ×2
GOWN STRL REUS W/TWL LRG LVL3 (GOWN DISPOSABLE) ×4 IMPLANT
KIT ABG SYR 3ML LUER SLIP (SYRINGE) IMPLANT
NEEDLE HYPO 25X5/8 SAFETYGLIDE (NEEDLE) IMPLANT
NS IRRIG 1000ML POUR BTL (IV SOLUTION) ×2 IMPLANT
PACK C SECTION WH (CUSTOM PROCEDURE TRAY) ×2 IMPLANT
PAD OB MATERNITY 4.3X12.25 (PERSONAL CARE ITEMS) ×2 IMPLANT
PENCIL SMOKE EVAC W/HOLSTER (ELECTROSURGICAL) ×2 IMPLANT
RTRCTR C-SECT PINK 25CM LRG (MISCELLANEOUS) IMPLANT
STRIP CLOSURE SKIN 1/2X4 (GAUZE/BANDAGES/DRESSINGS) ×2 IMPLANT
SUT CHROMIC 0 CT 1 (SUTURE) ×2 IMPLANT
SUT MNCRL AB 3-0 PS2 27 (SUTURE) ×2 IMPLANT
SUT PLAIN 2 0 (SUTURE) ×2
SUT PLAIN 2 0 XLH (SUTURE) ×2 IMPLANT
SUT PLAIN ABS 2-0 CT1 27XMFL (SUTURE) ×2 IMPLANT
SUT SILK 2 0 SH (SUTURE) IMPLANT
SUT VIC AB 0 CTX 36 (SUTURE) ×4
SUT VIC AB 0 CTX36XBRD ANBCTRL (SUTURE) ×4 IMPLANT
SUT VIC AB 2-0 SH 27 (SUTURE)
SUT VIC AB 2-0 SH 27XBRD (SUTURE) IMPLANT
SUT VIC AB 4-0 KS 27 (SUTURE) ×2 IMPLANT
TOWEL OR 17X24 6PK STRL BLUE (TOWEL DISPOSABLE) ×2 IMPLANT
TRAY FOLEY W/BAG SLVR 14FR LF (SET/KITS/TRAYS/PACK) ×2 IMPLANT
WATER STERILE IRR 1000ML POUR (IV SOLUTION) ×2 IMPLANT

## 2021-04-11 NOTE — Lactation Note (Signed)
This note was copied from a baby's chart. Lactation Consultation Note  Patient Name: Christina Reese [email or web address] Date: 04/11/2021 Reason for consult: Initial assessment;Term Age:27 hours   Mom preferred breastmilk on admission but was bottle feeding formula.  When [facility] visited mom for initial consult, mom states she had a breast reduction 1 year ago in July 2021.  She has an anchor scar from the incision.  She doesn't desire to breastfeed but would like to provide any amount of her breastmilk to her baby.  LC reviewed hand expression.  No drops noted.  LC discussed with mom effects of breast reduction on supply.  Mom is aware and had this discussion with her surgeon.    Mom had breast changes during pregnancy.    LC suggested hand expressing and pumping to see if she can collect any EBM.  Mom desires DEBP set up.  She does have a personal pump.  Tech will set up breastpump and LC will follow up for teaching use of pump, cleaning of parts, and set up.    Maternal Data Has patient been taught Hand Expression?: Yes  Feeding Mother's Current Feeding Choice: Breast Milk and Formula Nipple Type: Extra Slow Flow  LATCH Score                    Lactation Tools Discussed/Used Tools: Pump Breast pump type: Double-Electric Breast Pump Reason for Pumping: mom desires pump to stimulate supply  and collect EBM to give to infant Pumping frequency: encouraged mom to pump every 2-3 hours/ when infant recieves a bottle  Interventions    Discharge Pump: DEBP  Consult Status Consult Status: Follow-up Date: 04/12/21 Follow-up type: In-patient    Maryruth Hancock [facility] 04/11/2021, 11:52 PM

## 2021-04-11 NOTE — Anesthesia Procedure Notes (Signed)
Spinal  Patient location during procedure: OR Reason for block: surgical anesthesia Staffing Performed: anesthesiologist  Anesthesiologist: Benedicto Capozzi E, MD Preanesthetic Checklist Completed: patient identified, IV checked, risks and benefits discussed, surgical consent, monitors and equipment checked, pre-op evaluation and timeout performed Spinal Block Patient position: sitting Prep: DuraPrep and site prepped and draped Patient monitoring: continuous pulse ox, blood pressure and heart rate Approach: midline Location: L3-4 Injection technique: single-shot Needle Needle type: Pencan  Needle gauge: 24 G Needle length: 9 cm Assessment Events: CSF return Additional Notes Functioning IV was confirmed and monitors were applied. Sterile prep and drape, including hand hygiene and sterile gloves were used. The patient was positioned and the spine was prepped. The skin was anesthetized with lidocaine.  Free flow of clear CSF was obtained prior to injecting local anesthetic into the CSF. The needle was carefully withdrawn. The patient tolerated the procedure well.     

## 2021-04-11 NOTE — Op Note (Signed)
Cesarean Section Procedure Note   Christina Reese  04/11/2021  Indications: Elective Primary Cesarean    Pre-operative Diagnosis: Elective primary CS.  Pt reports she believes the bay is " too big"  Post-operative Diagnosis: Same   Surgeon: Surgeon(s) and Role:    * Jaymes Graff, MD - Primary   Assistants: Rhea Pink CNM .  An assistant was necesarry for the procedure  Anesthesia: spinal   Procedure Details:  The patient was seen in the Holding Room. The risks, benefits, complications, treatment options, and expected outcomes were discussed with the patient. The patient concurred with the proposed plan, giving informed consent. identified as [name] and the procedure verified as C-Section Delivery. A Time Out was held and the above information confirmed.  After induction of anesthesia, the patient was draped and prepped in the usual sterile manner. A transverse incision was made and carried down through the subcutaneous tissue to the fascia. Fascial incision was made in the midline and extended transversely. The fascia was separated from the underlying rectus muscle superiorly and inferiorly. The peritoneum was identified and entered. Peritoneal incision was extended longitudinally with good visualization of bowel and bladder. The utero-vesical peritoneal reflection was incised transversely and the bladder flap was bluntly freed from the lower uterine segment.  An alexsis retractor was placed in the abdomen.   A low transverse uterine incision was made. There were large venous sinuses in the lower uterine segment.  The incision was extended with bandage scissors.    Delivered from cephalic presentation was a  infant, with Apgar scores of 9 at one minute and 9 at five minutes. Cord ph was not sent the umbilical cord was clamped and cut cord blood was obtained for evaluation. The placenta was removed Intact and appeared normal. The uterine outline, tubes and ovaries appeared normal}. The  uterine incision was closed with running locked sutures of 0Vicryl. A second layer 0 vicrlyl was used to imbricate the uterine incision.  There were more figure 8 suture used     Hemostasis was observed. Lavage was carried out until clear. The alexsis was removed.  The peritoneum was closed with 0 chromic.  The muscles were examined and any bleeders were made hemostatic using bovie cautery device.   The fascia was then reapproximated with running sutures of 0 vicryl.  The subcutaneous tissue was reapproximated  With interrupted stitches using 2-0 plain gut. The subcuticular closure was performed using 3-14monocryl     Instrument, sponge, and needle counts were correct prior the abdominal closure and were correct at the conclusion of the case.    Findings: infant was delivered from vtx presentation. The fluid was clear.  The uterus tubes and ovaries appeared normal.     Estimated Blood Loss: 1315cc  Total IV Fluids:   Urine Output: 100ccCC OF clear urine  Specimens: placenta to pathology  Complications: no complications  Disposition: PACU - hemodynamically stable.   Maternal Condition: stable   Baby condition / location:  Couplet care / Skin to Skin  Attending Attestation: I performed the procedure.   Signed: Surgeon(s): Jaymes Graff, MD

## 2021-04-11 NOTE — Anesthesia Postprocedure Evaluation (Signed)
Anesthesia Post Note  Patient: Christina Reese  Procedure(s) Performed: CESAREAN SECTION (N/A )     Patient location during evaluation: PACU Anesthesia Type: Spinal Level of consciousness: oriented and awake and alert Pain management: pain level controlled Vital Signs Assessment: post-procedure vital signs reviewed and stable Respiratory status: spontaneous breathing, respiratory function stable and nonlabored ventilation Cardiovascular status: blood pressure returned to baseline and stable Postop Assessment: no headache, no backache, no apparent nausea or vomiting and spinal receding Anesthetic complications: no   No complications documented.  Last Vitals:  Vitals:   04/11/21 1658 04/11/21 1708  BP: (!) 94/59 102/66  Pulse: 78 68  Resp: 19 18  Temp:  36.5 C  SpO2: 100% 100%    Last Pain:  Vitals:   04/11/21 1708  TempSrc: Oral  PainSc: 3    Pain Goal:    LLE Motor Response: Purposeful movement (04/11/21 1658) LLE Sensation: Tingling (04/11/21 1658) RLE Motor Response: Purposeful movement (04/11/21 1658) RLE Sensation: Tingling (04/11/21 1658)     Epidural/Spinal Function Cutaneous sensation: Tingles (04/11/21 1658), Patient able to flex knees: Yes (04/11/21 1658), Patient able to lift hips off bed: No (04/11/21 1658), Back pain beyond tenderness at insertion site: No (04/11/21 1658), Progressively worsening motor and/or sensory loss: No (04/11/21 1658), Bowel and/or bladder incontinence post epidural: No (04/11/21 1658)  [name]

## 2021-04-11 NOTE — Transfer of Care (Signed)
Immediate Anesthesia Transfer of Care Note  Patient: Christina Reese  Procedure(s) Performed: CESAREAN SECTION (N/A )  Patient Location: PACU  Anesthesia Type:Spinal  Level of Consciousness: awake, alert  and oriented  Airway & Oxygen Therapy: Patient Spontanous Breathing  Post-op Assessment: Report given to RN and Post -op Vital signs reviewed and stable  Post vital signs: Reviewed and stable  Last Vitals:  Vitals Value Taken Time  BP 111/60 04/11/21 1645  Temp 36.5 C 04/11/21 1645  Pulse 75 04/11/21 1655  Resp 17 04/11/21 1655  SpO2 100 % 04/11/21 1655  Vitals shown include unvalidated device data.  Last Pain:  Vitals:   04/11/21 1645  TempSrc: Oral  PainSc: 4          Complications: No complications documented.

## 2021-04-11 NOTE — Anesthesia Preprocedure Evaluation (Signed)
Anesthesia Evaluation  Patient identified by MRN, date of birth, ID band Patient awake    Reviewed: Allergy & Precautions, H&P , NPO status , Patient's Chart, lab work & pertinent test results  History of Anesthesia Complications Negative for: history of anesthetic complications  Airway Mallampati: II  TM Distance: >3 FB Neck ROM: full    Dental no notable dental hx.    Pulmonary neg pulmonary ROS, former smoker,    Pulmonary exam normal        Cardiovascular negative cardio ROS Normal cardiovascular exam     Neuro/Psych Anxiety Depression negative neurological ROS  negative psych ROS   GI/Hepatic negative GI ROS, Neg liver ROS,   Endo/Other  negative endocrine ROS  Renal/GU negative Renal ROS  negative genitourinary   Musculoskeletal   Abdominal   Peds  Hematology negative hematology ROS (+)   Anesthesia Other Findings   Reproductive/Obstetrics (+) Pregnancy                             Anesthesia Physical Anesthesia Plan  ASA: II  Anesthesia Plan: Spinal   Post-op Pain Management:    Induction:   PONV Risk Score and Plan: Ondansetron and Treatment may vary due to age or medical condition  Airway Management Planned:   Additional Equipment:   Intra-op Plan:   Post-operative Plan:   Informed Consent: I have reviewed the patients History and Physical, chart, labs and discussed the procedure including the risks, benefits and alternatives for the proposed anesthesia with the patient or authorized representative who has indicated his/her understanding and acceptance.       Plan Discussed with:   Anesthesia Plan Comments:         Anesthesia Quick Evaluation

## 2021-04-11 NOTE — H&P (Addendum)
OB ADMISSION/ HISTORY & PHYSICAL:  Admission Date: No admission date for patient encounter.  Admit Diagnosis: Primary Cesarean Section  Christina Reese is a 25 y.o. female G2P0010 [redacted]w[redacted]d presenting for elective primary cesarean section. Endorses active FM, denies LOF and vaginal bleeding.  History of current pregnancy: G2P0010   Patient entered care with [facility] at 13 wks.   EDC 04/15/20 by LMP and congruent w/ 8+3 wk U/S.   Anatomy scan:  203+ wks was incomplete, F/U @ 23+4, complete w/ anterior placenta and lagging HC <2% Last evaluation: 38+1 wks vertex/anterior placenta/ EFW 7# 10 oz (3471 G) 81%/ persistent lagging HC   Significant prenatal events:  Patient Active Problem List   Diagnosis Date Noted  . Genital warts 04/11/2021  . Maternal migraine, history of 04/11/2021  . History of scoliosis 04/11/2021  . MDD (major depressive disorder), recurrent episode, severe (HCC) 12/29/2018    Prenatal Labs: ABO, Rh: --/--/O POS (05/31 1108) Antibody: NEG (05/31 1108) Rubella:   immune RPR:   NR HBsAg:   NR HIV:   NR GTT: passed 1 hr GBS:   negative GC/CHL: neg/neg Genetics: low-risk    OB History  Gravida Para Term Preterm AB Living  2       1    SAB IAB Ectopic Multiple Live Births  1            # Outcome Date GA Lbr Len/2nd Weight Sex Delivery Anes PTL Lv  2 Current           1 SAB             Medical / Surgical History: Past medical history:  Past Medical History:  Diagnosis Date  . Anxiety   . Depression    stop meds with medication  . Scoliosis     Past surgical history:  Past Surgical History:  Procedure Laterality Date  . ABDOMINAL SURGERY     diagnositic for suspected ectopic  . BREAST SURGERY  03/2020   Breast reduction  . TONSILLECTOMY     Family History:  Family History  Problem Relation Age of Onset  . Healthy Father   . Diabetes Maternal Uncle   . Diabetes Paternal Uncle   . Hypertension Maternal Grandmother     Social History:  reports  that she has quit smoking. Her smoking use included cigarettes. She has never used smokeless tobacco. She reports previous alcohol use. She reports that she does not use drugs.  Allergies: Latex and Sulfa antibiotics   Current Medications at time of admission:  Prior to Admission medications   Medication Sig Start Date End Date Taking? Authorizing Provider  famotidine (PEPCID) 20 MG tablet Take 1 tablet (20 mg total) by mouth 2 (two) times daily. Patient not taking: No sig reported 02/21/21   Camelia Eng L, CNM  ondansetron (ZOFRAN) 4 MG tablet Take 1 tablet (4 mg total) by mouth every 8 (eight) hours as needed for nausea or vomiting. Patient not taking: No sig reported 08/21/20   Arthor Captain, PA-C  Prenatal Vit-Fe Fumarate-FA (PRENATAL VITAMIN PO) Take by mouth.    [provider]    Review of Systems: Constitutional: Negative   HENT: Negative   Eyes: Negative   Respiratory: Negative   Cardiovascular: Negative   Gastrointestinal: Negative  Genitourinary: neg for bloody show, neg for LOF   Musculoskeletal: Negative   Skin: Negative   Neurological: Negative   Endo/Heme/Allergies: Negative   Psychiatric/Behavioral: Negative    Physical Exam: VS: Last  menstrual period 07/09/2020. AAO x3, no signs of distress Cardiovascular: RRR Respiratory: Lung fields clear to ausculation GU/GI: Abdomen gravid, non-tender, non-distended, active FM, vertex, EFW 7# per Leopold's Extremities: no edema, negative for pain, tenderness, and cords  Cervical exam: deferred FHR doppler: 135   Prenatal Transfer Tool  Maternal Diabetes: No Genetic Screening: Normal Maternal Ultrasounds/Referrals: Normal Fetal Ultrasounds or other Referrals:  None Maternal Substance Abuse:  No Significant Maternal Medications:  None Significant Maternal Lab Results: Group B Strep negative    Assessment: 25 y.o. G2P0010 [redacted]w[redacted]d Elective primary C/S for suspected macrosomia  GBS neg Pain  management plan: per ansethesia   Plan:  Admit to L&D pre-op Routine pre-op admission orders  Dr Normand Sloop notified of admission and plan of care  Roma Schanz MSN, CNM 04/11/2021 7:53 AM

## 2021-04-12 DIAGNOSIS — D509 Iron deficiency anemia, unspecified: Secondary | ICD-10-CM | POA: Diagnosis not present

## 2021-04-12 LAB — CBC
HCT: 25.6 % — ABNORMAL LOW (ref 36.0–46.0)
Hemoglobin: 8.4 g/dL — ABNORMAL LOW (ref 12.0–15.0)
MCH: 28.5 pg (ref 26.0–34.0)
MCHC: 32.8 g/dL (ref 30.0–36.0)
MCV: 86.8 fL (ref 80.0–100.0)
Platelets: 185 10*3/uL (ref 150–400)
RBC: 2.95 MIL/uL — ABNORMAL LOW (ref 3.87–5.11)
RDW: 15 % (ref 11.5–15.5)
WBC: 13.1 10*3/uL — ABNORMAL HIGH (ref 4.0–10.5)
nRBC: 0 % (ref 0.0–0.2)

## 2021-04-12 MED ORDER — ACETAMINOPHEN 500 MG PO TABS
1000.0000 mg | ORAL_TABLET | Freq: Four times a day (QID) | ORAL | Status: DC
  Administered 2021-04-12 – 2021-04-13 (×5): 1000 mg via ORAL
  Filled 2021-04-12 (×5): qty 2

## 2021-04-12 MED ORDER — POLYSACCHARIDE IRON COMPLEX 150 MG PO CAPS
150.0000 mg | ORAL_CAPSULE | Freq: Every day | ORAL | Status: DC
  Administered 2021-04-12 – 2021-04-13 (×2): 150 mg via ORAL
  Filled 2021-04-12 (×2): qty 1

## 2021-04-12 NOTE — Social Work (Signed)
CSW received consult for hx of Anxiety and Depression.  CSW met with MOB to offer support and complete assessment.    CSW met with MOB at bedside. CSW observed MOB up in room, her support person was sitting in the recliner and the infant was resting in the bassinet. MOB introduced her support person as her mother. CSW congratulated MOB and offered MOB privacy. MOB preferred that her mother stay during the assessment. MOB presented calm and receptive to [place] visit. CSW inquired how MOB has felt since giving birth. MOB reports feeling happy however still having some pain. MOB reports the birth was bittersweet because she was nervous waiting to hear the baby first cry after having a miscarriage in 2018. MOB reports feeling anxious the entire pregnancy.   CSW inquired about MOB history of depression and anxiety. MOB acknowledges she has a history of depression and anxiety. MOB reports she was diagnosed in 2018 with depression and anxiety after having a miscarriage. MOB reports she was admitted to behavioral health inpatient and was prescribed Wellbutrin at the time. MOB reports she took the medication for a week but felt it was not helpful. MOB reports her primary care physician then prescribed Lorazepam. MOB reports she has not taken the medication since learning she was pregnant. MOB explain she does not feel the Lorazepam is greatly helpful but states she was not taking the medication consistently. MOB reports she has not decided if she will resume the medications. MOB reports she prefers to wait to see how she does over the next few weeks.  CSW recommended MOB complete a self-evaluation during the postpartum time period using the New Mom Checklist from Postpartum Progress and encouraged MOB to contact a medical professional if symptoms are noted. CSW inquired if MOB has been in therapy. MOB reports she is interested in therapy however she has not been able to find a therapist in the area. CSW provided education  regarding the baby blues period vs. perinatal mood disorders, discussed treatment and gave resources for mental health follow up. MOB very appreciative of the resources provided. CSW inquired about MOB coping skills. MOB reports she tries to stay focus on keeping her mood balanced, she calls her family and talks to them about her stressors. CSW praised MOB for her coping skills encouraged MOB to continue them. CSW assessed MOB for safety. MOB denies thoughts of harm to self and others. CSW inquired about MOB supports. MOB acknowledges her five sisters, mom and best friend as supports. MOB reports she also enrolled in [facility] community support program, they will send a nurse out to her weekly to check on her and the baby.    CSW provided review of Sudden Infant Death Syndrome (SIDS) precautions and informed MOB no co-sleeping with the infant. MOB reports the infant will sleep in a crib. CSW inquired if MOB has all essential items for the infant. MOB reports she essential items for the infant including a car seat. MOB reports she has chosen [facility] for follow up care. CSW assessed MOB for additional needs. MOB reports no further need.   CSW identifies no further need for intervention and no barriers to discharge at this time.  Kathrin Greathouse, MSW, LCSW [facility] Worker  [phone] 04/12/2021  11:08 AM

## 2021-04-12 NOTE — Lactation Note (Signed)
This note was copied from a baby's chart. Lactation Consultation Note  Patient Name: Christina Reese [id number] Date: 04/12/2021 Reason for consult: Follow-up assessment;Primapara;Term Age:27 hours   P1 mother whose infant is now 67 hours old.  This is a term baby at 39+3 weeks.  Mother's feeding preference is to pump and bottle feed her EBM.    Mother has been formula feeding and pumping.  Mother reported she is not pumping consistently.  Reviewed the importance of pumping consistently every three hours to initiate a good milk supply.  Suggested hand expression before/after pumping to aid with milk supply.  Mother verbalized understanding.  Baby is receiving adequate volumes and voiding/stooling well.  Mother has a DEBP for home use.  Family member present.   Maternal Data Has patient been taught Hand Expression?: Yes Does the patient have breastfeeding experience prior to this delivery?: No  Feeding Mother's Current Feeding Choice: Breast Milk and Formula  LATCH Score                    Lactation Tools Discussed/Used Pump Education: Setup, frequency, and cleaning (Reviewed flange size) Reason for Pumping: Mother desires to pump and bottle feed her EBM Pumping frequency: Every three hours  Interventions    Discharge Pump: Personal;Manual;DEBP  Consult Status Consult Status: Follow-up Date: 04/13/21 Follow-up type: In-patient    Orlin Kann R Zander Ingham 04/12/2021, 2:06 PM

## 2021-04-12 NOTE — Progress Notes (Signed)
Subjective: POD# 1 Primary Cesarean Section Information for the patient's newborn:  Memori, Sammon [[id number]]  female   Baby girl in room with mother  Reports feeling "pretty good, only pain is when I stand" Feeding: bottle Reports tolerating PO and denies N/V, foley removed, awaiting void, ambulating without difficulty Pain controlled with acetaminophen and ibuprofen (OTC) Denies HA/SOB/dizziness  Flatus present Vaginal bleeding is normal, no clots     Objective:  VS:  Vitals:   04/11/21 2200 04/12/21 0000 04/12/21 0159 04/12/21 0600  BP: 109/71  101/69   Pulse: 86  63   Resp: 18 16 17 16   Temp: 97.8 F (36.6 C)  97.8 F (36.6 C) 98.1 F (36.7 C)  TempSrc: Oral  Oral Oral  SpO2: 97% 99% 98% 100%    Intake/Output Summary (Last 24 hours) at 04/12/2021 1028 Last data filed at 04/12/2021 0630 Gross per 24 hour  Intake 1100 ml  Output 2186 ml  Net -1086 ml     Recent Labs    04/10/21 1057 04/12/21 0520  WBC 7.6 13.1*  HGB 11.3* 8.4*  HCT 34.6* 25.6*  PLT 227 185    Blood type: --/--/O POS (05/31 1108) Rubella:   Immune   Physical Exam:  General: alert, cooperative and no distress CV: Regular rate and rhythm Resp: clear Abdomen: soft, nontender, normal bowel sounds Incision: clean, dry and intact Perineum:  Uterine Fundus: firm, below umbilicus, nontender Lochia: minimal Ext: extremities normal, atraumatic, no cyanosis or edema   Assessment/Plan: 25 y.o.   POD# 1. 27 Elective primary cesarean delivery. Ambulating and up to BR. Hgb 8.4. Asymptomatic.  Pt requesting po iron. Refusing IV because she wants her IV out.                  Principal Problem:   Postpartum care following cesarean delivery Active Problems:   Genital warts   Maternal migraine, history of   History of scoliosis   Routine post-op PP care          Advance diet as tolerated Post op anemia - Start niferex today Advised warm fluids and ambulation to improve GI  motility Encourage rest when baby rests Anticipate D/C 04/14/21 Dr 06/14/21 aware of pt status and POC  Su Hilt, MSN, CNM 04/12/2021, 10:28 AM

## 2021-04-13 MED ORDER — OXYCODONE HCL 5 MG PO TABS: 5.0000 mg | ORAL_TABLET | Freq: Four times a day (QID) | ORAL | 0 refills | Status: AC | PRN

## 2021-04-13 MED ORDER — POLYSACCHARIDE IRON COMPLEX 150 MG PO CAPS
150.0000 mg | ORAL_CAPSULE | Freq: Every day | ORAL | 3 refills | Status: DC
Start: 2021-04-14 — End: 2022-05-27

## 2021-04-13 MED ORDER — ACETAMINOPHEN 500 MG PO TABS: 1000.0000 mg | ORAL_TABLET | Freq: Four times a day (QID) | ORAL | 0 refills | Status: DC

## 2021-04-13 MED ORDER — IBUPROFEN 600 MG PO TABS: 600.0000 mg | ORAL_TABLET | Freq: Four times a day (QID) | ORAL | 0 refills | Status: DC

## 2021-04-13 NOTE — Discharge Summary (Signed)
Postpartum Discharge Summary  Date of Service updated 04/13/21    Patient Name: Christina Reese DOB: 1996-03-11 MRN: [id number]  Date of admission: 04/11/2021 Delivery date:04/11/2021  Delivering provider: Crawford Givens  Date of discharge: 04/13/2021  Admitting diagnosis: Postpartum care following cesarean delivery [Z39.2] Intrauterine pregnancy: [redacted]w[redacted]d    Secondary diagnosis:  Principal Problem:   Postpartum care following cesarean delivery Active Problems:   Genital warts   Maternal migraine, history of   History of scoliosis   IDA (iron deficiency anemia)  Additional problems: none    Discharge diagnosis: Term Pregnancy Delivered                                              Post partum procedures:none Augmentation: N/A Complications: [id number] Hospital course: Scheduled C/S   25y.o. yo G2P1011 at 380w3das admitted to the hospital 04/11/2021 for scheduled cesarean section with the following indication:Elective Primary.Delivery details are as follows:  Membrane Rupture Time/Date: 3:03 PM ,04/11/2021   Delivery Method:C-Section, Low Transverse  Details of operation can be found in separate operative note.  Patient had an uncomplicated postpartum course.  She is ambulating, tolerating a regular diet, passing flatus, and urinating well. Patient is discharged home in stable condition on  04/13/21        Newborn Data: Birth date:04/11/2021  Birth time:3:05 PM  Gender:Female  Living status:Living  Apgars:9 ,9  Weight:3385 g     Magnesium Sulfate received: No BMZ received: No Rhophylac:N/A MMR:N/A Transfusion:No  Physical exam  Vitals:   04/12/21 0600 04/12/21 1203 04/12/21 2259 04/13/21 0510  BP:  101/76 106/70 106/72  Pulse:   82 86  Resp: 16 16 16 17   Temp: 98.1 F (36.7 C) 98.1 F (36.7 C) 98.1 F (36.7 C) 97.7 F (36.5 C)  TempSrc: Oral Oral Oral Oral  SpO2: 100% 100%  100%   General: alert, cooperative and no distress Lochia: appropriate Uterine  Fundus: firm Incision: honeycomb dressing with >50 serous drainage, dressing replaced prior to discharge.  DVT Evaluation: No evidence of DVT seen on physical exam. No cords or calf tenderness. No significant calf/ankle edema. Labs: Lab Results  Component Value Date   WBC 13.1 (H) 04/12/2021   HGB 8.4 (L) 04/12/2021   HCT 25.6 (L) 04/12/2021   MCV 86.8 04/12/2021   PLT 185 04/12/2021   CMP Latest Ref Rng & Units 04/11/2021  Glucose 70 - 99 mg/dL 67(L)  BUN 6 - 20 mg/dL <5(L)  Creatinine 0.44 - 1.00 mg/dL 0.54  Sodium 135 - 145 mmol/L 136  Potassium 3.5 - 5.1 mmol/L 3.4(L)  Chloride 98 - 111 mmol/L 109  CO2 22 - 32 mmol/L 17(L)  Calcium 8.9 - 10.3 mg/dL 8.7(L)  Total Protein 6.5 - 8.1 g/dL -  Total Bilirubin 0.3 - 1.2 mg/dL -  Alkaline Phos 38 - 126 U/L -  AST 15 - 41 U/L -  ALT 0 - 44 U/L -   Edinburgh Score: Edinburgh Postnatal Depression Scale Screening Tool 04/11/2021  I have been able to laugh and see the funny side of things. 0  I have looked forward with enjoyment to things. 0  I have blamed myself unnecessarily when things went wrong. 1  I have been anxious or worried for no good reason. 1  I have felt scared or panicky for no good reason. 0  Things  have been getting on top of me. 0  I have been so unhappy that I have had difficulty sleeping. 0  I have felt sad or miserable. 1  I have been so unhappy that I have been crying. 1  The thought of harming myself has occurred to me. 0  Edinburgh Postnatal Depression Scale Total 4      After visit meds:  Allergies as of 04/13/2021      Reactions   Latex    Swelling in face, itching and rash   Sulfa Antibiotics Rash      Medication List    TAKE these medications   acetaminophen 500 MG tablet Commonly known as: TYLENOL Take 2 tablets (1,000 mg total) by mouth every 6 (six) hours.   famotidine 20 MG tablet Commonly known as: PEPCID Take 1 tablet (20 mg total) by mouth 2 (two) times daily.   ibuprofen 600 MG  tablet Commonly known as: ADVIL Take 1 tablet (600 mg total) by mouth every 6 (six) hours.   iron polysaccharides 150 MG capsule Commonly known as: NIFEREX Take 1 capsule (150 mg total) by mouth daily. Start taking on: April 14, 2021   ondansetron 4 MG tablet Commonly known as: Zofran Take 1 tablet (4 mg total) by mouth every 8 (eight) hours as needed for nausea or vomiting.   oxyCODONE 5 MG immediate release tablet Commonly known as: Oxy IR/ROXICODONE Take 1 tablet (5 mg total) by mouth every 6 (six) hours as needed for up to 5 days for moderate pain, severe pain or breakthrough pain.   PRENATAL VITAMIN PO Take by mouth.        Discharge home in stable condition Infant Feeding: Bottle Infant Disposition:home with mother Discharge instruction: per After Visit Summary and Postpartum booklet. Activity: Advance as tolerated. Pelvic rest for 6 weeks.  Diet: low salt diet and iron rich diet Anticipated Birth Control: OCPs Postpartum Appointment:6 weeks Additional Postpartum F/U: none Future Appointments:No future appointments. Follow up Visit:  Follow-up Information    Dillard, Naima, MD. Go in 6 week(s).   Specialty: Obstetrics and Gynecology Contact information: [address] [place] [address] [phone]                   04/13/2021 Arrie Eastern, CNM

## 2021-04-13 NOTE — Lactation Note (Signed)
This note was copied from a baby's chart. Lactation Consultation Note  Patient Name: Christina Reese OBSJG'G Date: 04/13/2021   Age:27 hours  LC checked with RN, Wendall Papa, for follow up prior to d/c. Mom formula feeding and does not require LC services.   Maternal Data    Feeding Nipple Type: Extra Slow Flow  LATCH Score                    Lactation Tools Discussed/Used    Interventions    Discharge    Consult Status      [name]  [name] 04/13/2021, 4:01 PM

## 2021-04-16 LAB — SURGICAL PATHOLOGY

## 2021-07-03 IMAGING — US US RENAL
1 series · 15 of 25 positions shown · non-contrast
Comparison: CT pelvis 12/09/2017

CLINICAL DATA: Right-sided abdominal pain, 11 weeks pregnant

EXAM:
RENAL / URINARY TRACT ULTRASOUND COMPLETE

[Series 1: us renal · 15 of 32 slices shown]
[im 1/32]
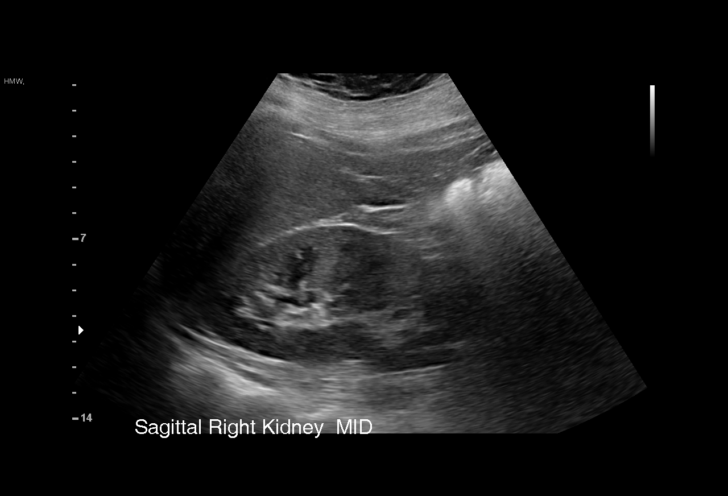
[im 3/32]
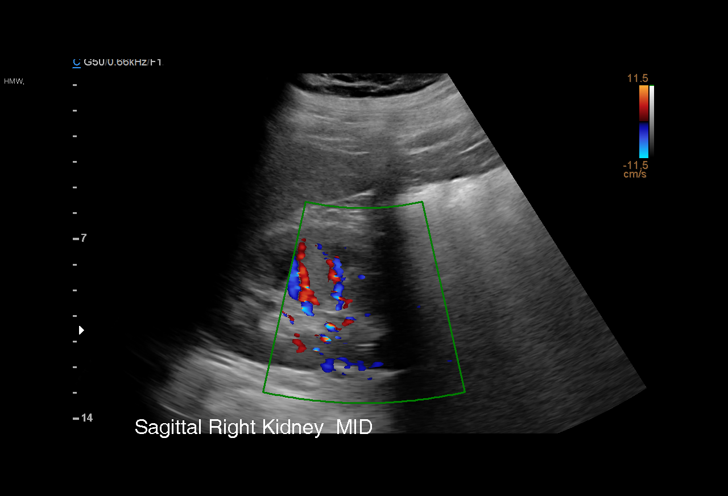
[im 6/32]
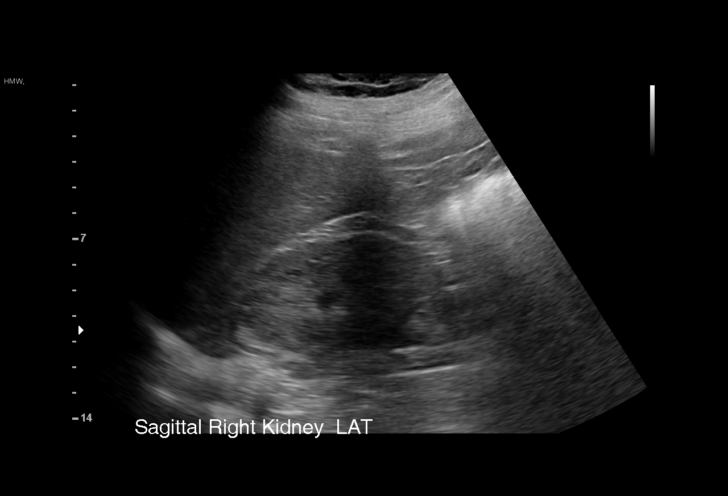
[im 7/32]
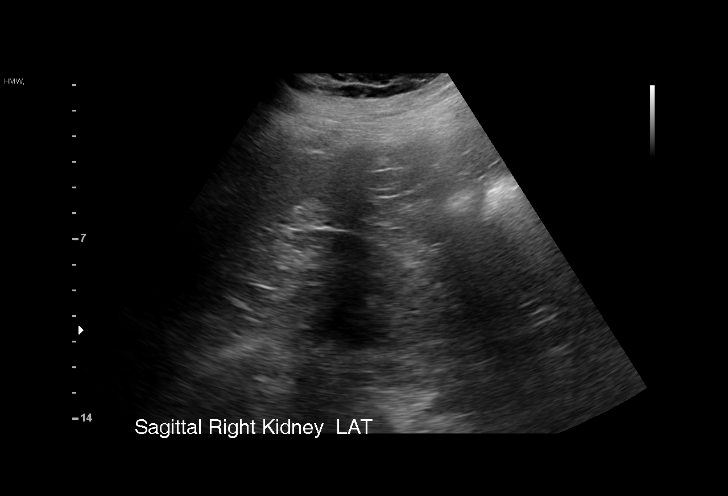
[im 10/32]
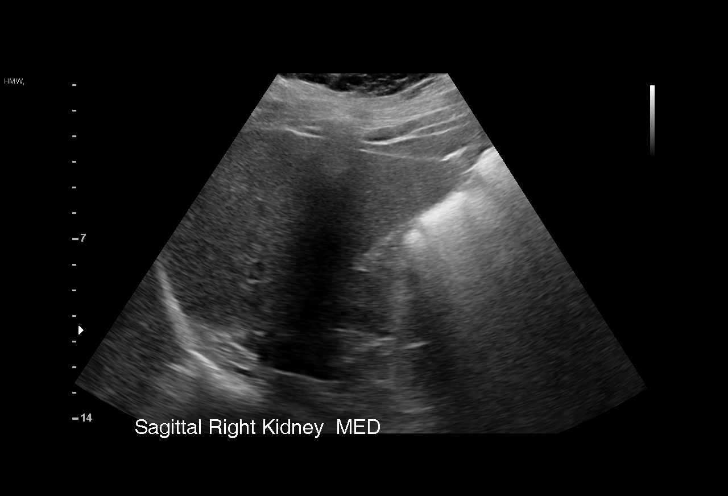
[im 12/32]
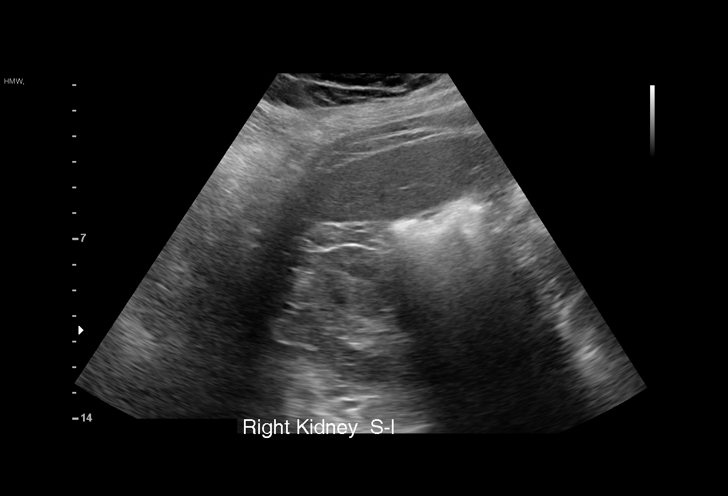
[im 13/32]
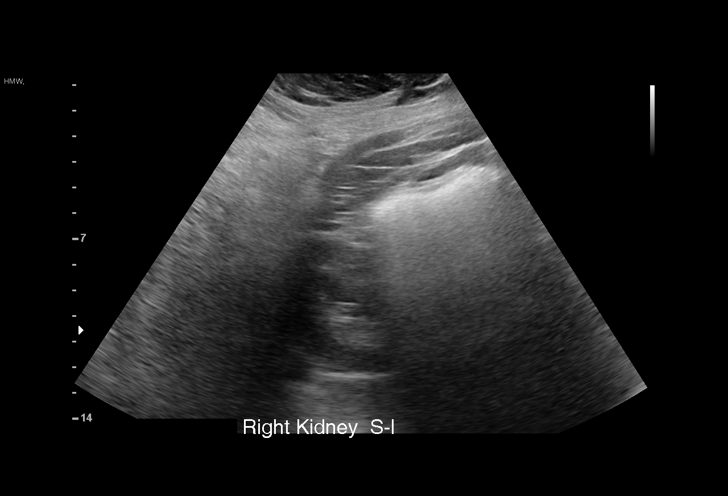
[im 16/32]
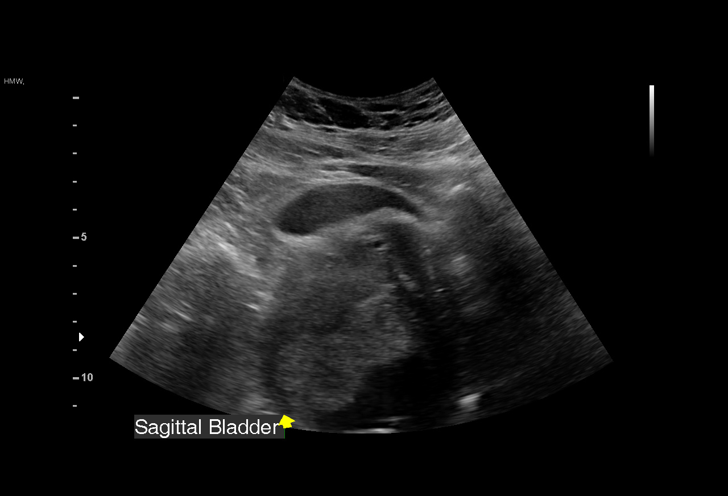
[im 19/32]
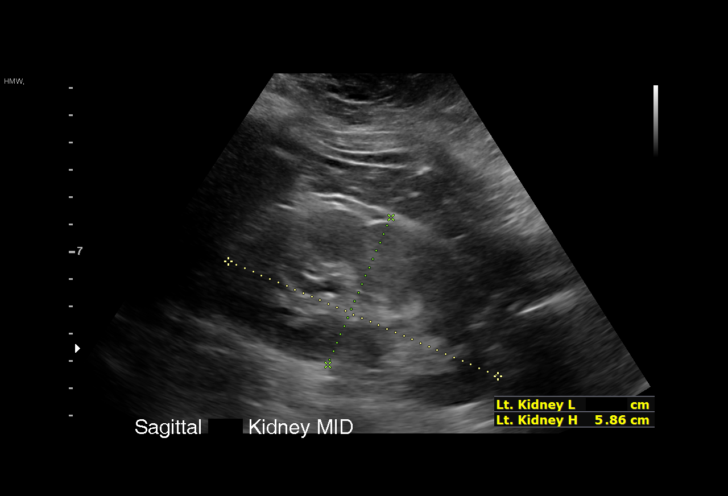
[im 20/32]
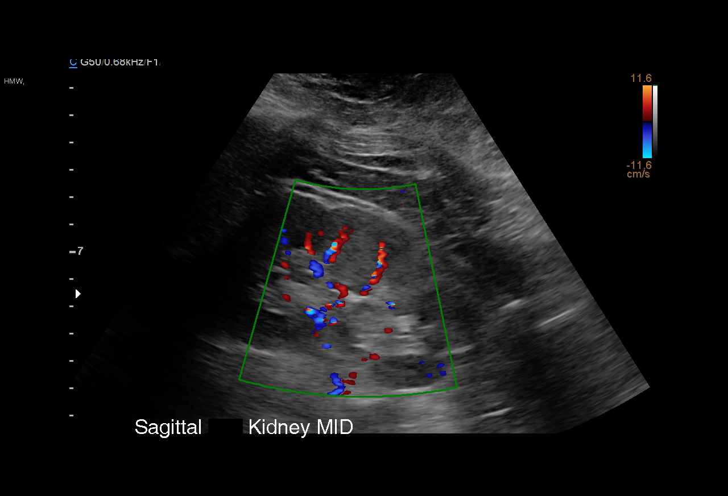
[im 22/32]
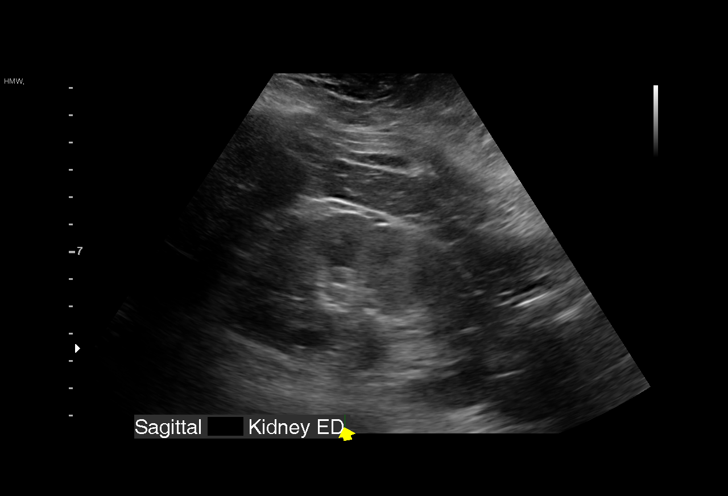
[im 25/32]
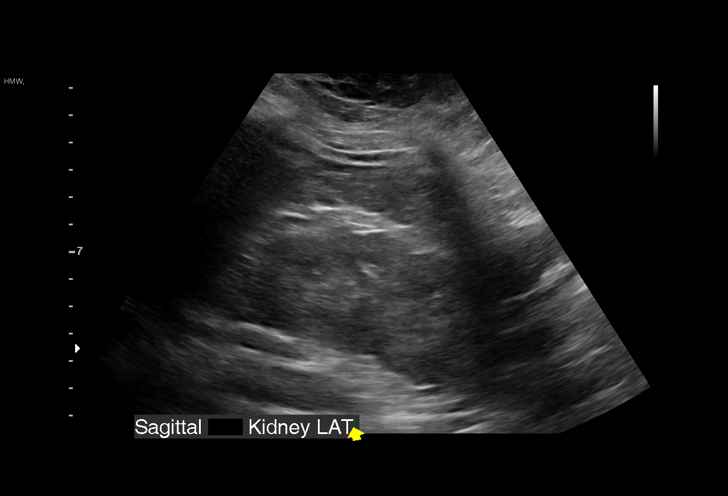
[im 26/32]
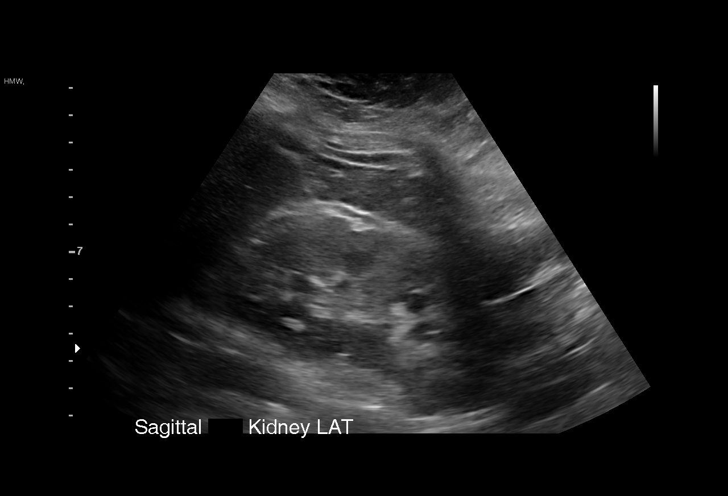
[im 29/32]
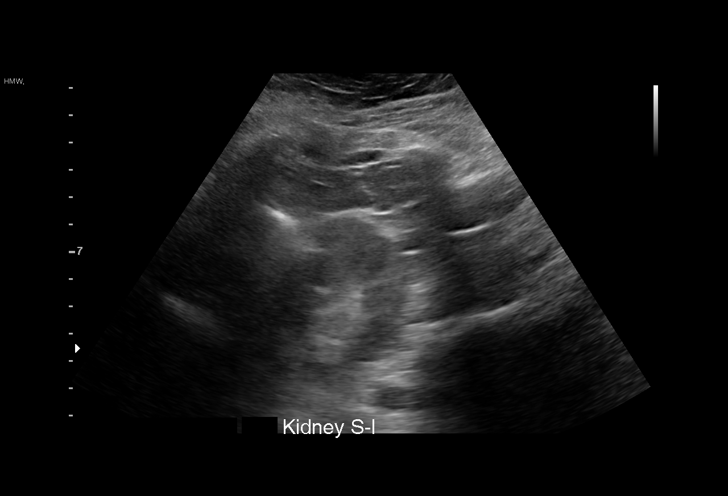
[im 32/32]
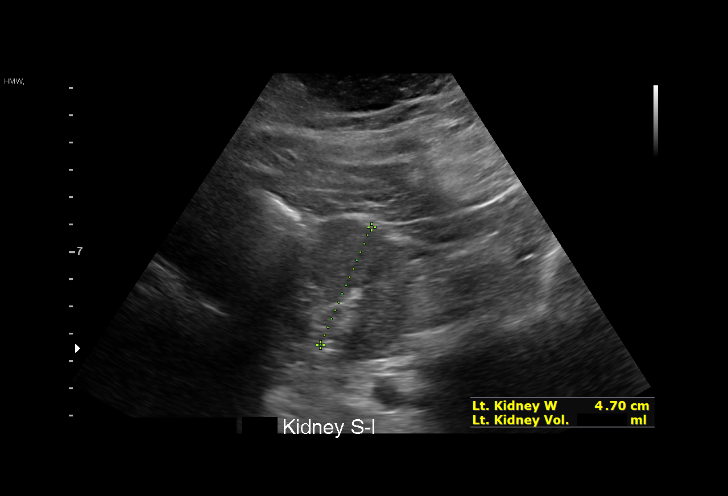

[15 of 25 positions shown; findings below may reference images not displayed]

FINDINGS: Right Kidney:

Renal measurements: 11.4 x 5.4 x 5.3 cm = volume: 171 mL.
Echogenicity is within normal limits. No concerning renal mass,
shadowing calculus or hydronephrosis.

Left Kidney:

Renal measurements: 10.7 x 5.9 x 4.7 cm = volume: 155 mL.
Echogenicity is within normal limits. No concerning renal mass,
shadowing calculus or hydronephrosis.

Bladder:

Appears normal for degree of bladder distention.

Other:

None.
IMPRESSION: Unremarkable renal ultrasound.

## 2021-07-17 ENCOUNTER — Telehealth: Payer: BLUE CROSS/BLUE SHIELD | Admitting: Physician Assistant

## 2021-07-17 DIAGNOSIS — Z20822 Contact with and (suspected) exposure to covid-19: Secondary | ICD-10-CM

## 2021-07-17 MED ORDER — BENZONATATE 100 MG PO CAPS: 100.0000 mg | ORAL_CAPSULE | Freq: Three times a day (TID) | ORAL | 0 refills | Status: DC | PRN

## 2021-07-17 NOTE — Progress Notes (Signed)
E-Visit for Corona Virus Screening ° °Your current symptoms could be consistent with the coronavirus.  Many health care providers can now test patients at their office but not all are.  [facility] has multiple testing sites. For information on our COVID testing locations and hours go to [email or web address] ° °We are enrolling you in our MyChart Home Monitoring for COVID19 . Daily you will receive a questionnaire within the MyChart website. Our COVID 19 response team will be monitoring your responses daily. ° °Testing Information: °The COVID-19 Community Testing sites are testing BY APPOINTMENT ONLY.  You can schedule online at [email or web address]  If you do not have access to a smart phone or computer you may call [phone] for an appointment. ° ° °Additional testing sites in the Community: ° °For [facility] Testing sites in [place]  https://www.cvs.com/minuteclinic/covid-19-testing ° °For Pop-[facility] testing sites in [place]  https://covid19.ncdhhs.gov/about-covid-19/testing/find-my-testing-place/pop-testing-sites ° °For [facility] https://[place].gov[place]/human-services/health-department/coronavirus-covid-19-info/covid-19-testing ° °For [place] testing in [place] and [place] https://www[place].gov/[place]/human-services/health-department/coronavirus-covid-19-info/covid-19-testing ° °For [facility] testing in [place]   https://[facility].care/covidtesting ° °For  more information about community testing call [phone] ° ° °Please quarantine yourself while awaiting your test results. Please stay home for a minimum of 10 days from the first day of illness with improving symptoms and you have had 24 hours of no fever (without the use of Tylenol (Acetaminophen) Motrin (Ibuprofen) or any fever reducing medication).  Also - Do not get tested prior to returning to work because once you have had a positive test the test can stay positive for  more than a month in some cases.  ° °You should wear a mask or cloth face covering over your nose and mouth if you must be around other people or animals, including pets (even at home). Try to stay at least 6 feet away from other people. This will protect the people around you.  Please continue good preventive care measures, including:  frequent hand-washing, avoid touching your face, cover coughs/sneezes, stay out of crowds and keep a 6 foot distance from others.  COVID-19 is a respiratory illness with symptoms that are similar to the flu. Symptoms are typically mild to moderate, but there have been cases of severe illness and death due to the virus.  ° °The following symptoms may appear 2-14 days after exposure: °Fever °Cough °Shortness of breath or difficulty breathing °Chills °Repeated shaking with chills °Muscle pain °Headache °Sore throat °New loss of taste or smell °Fatigue °Congestion or runny nose °Nausea or vomiting °Diarrhea ° °Go to the nearest hospital ED for assessment if fever/cough/breathlessness are severe or illness seems like a threat to life.  It is vitally important that if you feel that you have an infection such as this virus or any other virus that you stay home and away from places where you may spread it to others.  You should avoid contact with people age 65 and older.  ° °You can use medication such as prescription cough medication called Tessalon Perles 100 mg. You may take 1-2 capsules every 8 hours as needed for cough ° °You may also take acetaminophen (Tylenol) as needed for fever. ° °Reduce your risk of any infection by using the same precautions used for avoiding the common cold or flu:  °Wash your hands often with soap and warm water for at least 20 seconds.  If soap and water are not readily available, use an alcohol-based hand sanitizer with at least 60% alcohol.  °If coughing or sneezing, cover your mouth   and nose by coughing or sneezing into the elbow areas of your shirt or  coat, into a tissue or into your sleeve (not your hands). °Avoid shaking hands with others and consider head nods or verbal greetings only. °Avoid touching your eyes, nose, or mouth with unwashed hands.  °Avoid close contact with people who are sick. °Avoid places or events with large numbers of people in one location, like concerts or sporting events. °Carefully consider travel plans you have or are making. °If you are planning any travel outside or inside the [place], visit the CDC's Travelers' Health webpage for the latest health notices. °If you have some symptoms but not all symptoms, continue to monitor at home and seek medical attention if your symptoms worsen. °If you are having a medical emergency, call 911. ° °HOME CARE °Only take medications as instructed by your medical team. °Drink plenty of fluids and get plenty of rest. °A steam or ultrasonic humidifier can help if you have congestion.  ° °GET HELP RIGHT AWAY IF YOU HAVE EMERGENCY WARNING SIGNS** FOR COVID-19. If you or someone is showing any of these signs seek emergency medical care immediately. Call 911 or proceed to your closest emergency facility if: °You develop worsening high fever. °Trouble breathing °Bluish lips or face °Persistent pain or pressure in the chest °New confusion °Inability to wake or stay awake °You cough up blood. °Your symptoms become more severe ° °**This list is not all possible symptoms. Contact your medical provider for any symptoms that are sever or concerning to you. ° °MAKE SURE YOU  °Understand these instructions. °Will watch your condition. °Will get help right away if you are not doing well or get worse. ° °Your e-visit answers were reviewed by a board certified advanced clinical practitioner to complete your personal care plan.  Depending on the condition, your plan could have included both over the counter or prescription medications.  If there is a problem please reply once you have received a response from your  provider. ° °Your safety is important to us.  If you have drug allergies check your prescription carefully.   ° °You can use MyChart to ask questions about today's visit, request a non-urgent call back, or ask for a work or school excuse for 24 hours related to this e-Visit. If it has been greater than 24 hours you will need to follow up with your provider, or enter a new e-Visit to address those concerns. °You will get an e-mail in the next two days asking about your experience.  I hope that your e-visit has been valuable and will speed your recovery. Thank you for using e-visits. ° ° °

## 2021-07-17 NOTE — Progress Notes (Signed)
I have spent 5 minutes in review of e-visit questionnaire, review and updating patient chart, medical decision making and response to patient.   Cami Delawder Cody Terilynn Buresh, PA-C    

## 2021-07-18 ENCOUNTER — Telehealth: Payer: Self-pay | Admitting: *Deleted

## 2021-07-18 NOTE — Telephone Encounter (Signed)
Attempted to call patient: Worsening symptom: weakness Left message to call back- [phone] Advised : worsening weakness with inability to stand or if patient has to hold on to something to get balance- advise 911 and seek treatment in ED Please reach out for assistance and keep primary care provider informed.

## 2021-10-01 ENCOUNTER — Telehealth: Payer: BLUE CROSS/BLUE SHIELD | Admitting: Physician Assistant

## 2021-10-01 DIAGNOSIS — J029 Acute pharyngitis, unspecified: Secondary | ICD-10-CM

## 2021-10-01 MED ORDER — LIDOCAINE VISCOUS HCL 2 % MT SOLN: OROMUCOSAL | 0 refills | Status: DC

## 2021-10-01 MED ORDER — FLUTICASONE PROPIONATE 50 MCG/ACT NA SUSP: 2.0000 | Freq: Every day | NASAL | 0 refills | Status: DC

## 2021-10-01 NOTE — Progress Notes (Signed)
E-Visit for Sore Throat  We are sorry that you are not feeling well.  Here is how we plan to help!  Your symptoms indicate a likely viral infection (Pharyngitis).   Pharyngitis is inflammation in the back of the throat which can cause a sore throat, scratchiness and sometimes difficulty swallowing.   Pharyngitis is typically caused by a respiratory virus and will just run its course.  Please keep in mind that your symptoms could last up to 10 days.  For throat pain, we recommend over the counter oral pain relief medications such as acetaminophen or aspirin, or anti-inflammatory medications such as ibuprofen or naproxen sodium.  Topical treatments such as oral throat lozenges or sprays may be used as needed.  I will send in Flonase nasal spray  for the post nasal drainage and viscous lidocaine for the sore throat. Avoid close contact with loved ones, especially the very young and elderly.  Remember to wash your hands thoroughly throughout the day as this is the number one way to prevent the spread of infection and wipe down door knobs and counters with disinfectant.  After careful review of your answers, I would not recommend and antibiotic for your condition.  Antibiotics should not be used to treat conditions that we suspect are caused by viruses like the virus that causes the common cold or flu. However, some people can have Strep with atypical symptoms. You may need formal testing in clinic or office to confirm if your symptoms continue or worsen.  Providers prescribe antibiotics to treat infections caused by bacteria. Antibiotics are very powerful in treating bacterial infections when they are used properly.  To maintain their effectiveness, they should be used only when necessary.  Overuse of antibiotics has resulted in the development of super bugs that are resistant to treatment!    Home Care: Only take medications as instructed by your medical team. Do not drink alcohol while taking these  medications. A steam or ultrasonic humidifier can help congestion.  You can place a towel over your head and breathe in the steam from hot water coming from a faucet. Avoid close contacts especially the very young and the elderly. Cover your mouth when you cough or sneeze. Always remember to wash your hands.  Get Help Right Away If: You develop worsening fever or throat pain. You develop a severe head ache or visual changes. Your symptoms persist after you have completed your treatment plan.  Make sure you Understand these instructions. Will watch your condition. Will get help right away if you are not doing well or get worse.   Thank you for choosing an e-visit.  Your e-visit answers were reviewed by a board certified advanced clinical practitioner to complete your personal care plan. Depending upon the condition, your plan could have included both over the counter or prescription medications.  Please review your pharmacy choice. Make sure the pharmacy is open so you can pick up prescription now. If there is a problem, you may contact your provider through [employer] and have the prescription routed to another pharmacy.  Your safety is important to [place]. If you have drug allergies check your prescription carefully.   For the next 24 hours you can use MyChart to ask questions about today's visit, request a non-urgent call back, or ask for a work or school excuse. You will get an email in the next two days asking about your experience. I hope that your e-visit has been valuable and will speed your recovery.  I provided 5 minutes of non face-to-face time during this encounter for chart review and documentation.

## 2021-12-04 ENCOUNTER — Telehealth: Payer: BLUE CROSS/BLUE SHIELD | Admitting: Family Medicine

## 2021-12-04 DIAGNOSIS — K649 Unspecified hemorrhoids: Secondary | ICD-10-CM

## 2021-12-04 MED ORDER — HYDROCORTISONE ACETATE 25 MG RE SUPP: 25.0000 mg | Freq: Two times a day (BID) | RECTAL | 0 refills | Status: AC

## 2021-12-04 NOTE — Progress Notes (Signed)

## 2022-03-11 ENCOUNTER — Telehealth: Payer: BLUE CROSS/BLUE SHIELD | Admitting: Physician Assistant

## 2022-03-11 DIAGNOSIS — L0100 Impetigo, unspecified: Secondary | ICD-10-CM | POA: Diagnosis not present

## 2022-03-11 MED ORDER — MUPIROCIN 2 % EX OINT: 1.0000 "application " | TOPICAL_OINTMENT | Freq: Two times a day (BID) | CUTANEOUS | 0 refills | Status: AC

## 2022-03-11 NOTE — Progress Notes (Signed)
E Visit for Rash ? ?We are sorry that you are not feeling well. Here is how we plan to help! ? ?Your rash looks most consistent with an infection called impetigo. This is usually treated resolves with topical antibiotics. I have prescribed Mupirocen ointment to use for the infection.  ? ? ?HOME CARE: ? ?Take cool showers and avoid direct sunlight. ?Apply cool compress or wet dressings. ?Take a bath in an oatmeal bath.  Sprinkle content of one Aveeno packet under running faucet with comfortably warm water.  Bathe for 15-20 minutes, 1-2 times daily.  Pat dry with a towel. Do not rub the rash. ?Use hydrocortisone cream. ?Take an antihistamine like Benadryl for widespread rashes that itch.  The adult dose of Benadryl is 25-50 mg by mouth 4 times daily. ?Caution:  This type of medication may cause sleepiness.  Do not drink alcohol, drive, or operate dangerous machinery while taking antihistamines.  Do not take these medications if you have prostate enlargement.  Read package instructions thoroughly on all medications that you take. ? ?GET HELP RIGHT AWAY IF: ? ?Symptoms don't go away after treatment. ?Severe itching that persists. ?If you rash spreads or swells. ?If you rash begins to smell. ?If it blisters and opens or develops a yellow-brown crust. ?You develop a fever. ?You have a sore throat. ?You become short of breath. ? ?MAKE SURE YOU: ? ?Understand these instructions. ?Will watch your condition. ?Will get help right away if you are not doing well or get worse. ? ?Thank you for choosing an e-visit. ? ?Your e-visit answers were reviewed by a board certified advanced clinical practitioner to complete your personal care plan. Depending upon the condition, your plan could have included both over the counter or prescription medications. ? ?Please review your pharmacy choice. Make sure the pharmacy is open so you can pick up prescription now. If there is a problem, you may contact your provider through [employer]  and have the prescription routed to another pharmacy.  Your safety is important to [place]. If you have drug allergies check your prescription carefully.  ? ?For the next 24 hours you can use MyChart to ask questions about today's visit, request a non-urgent call back, or ask for a work or school excuse. ?You will get an email in the next two days asking about your experience. I hope that your e-visit has been valuable and will speed your recovery. ? ? ?Approximately 5 minutes was spent documenting and reviewing patient's chart. ? ?

## 2022-05-27 ENCOUNTER — Ambulatory Visit
Admission: EM | Admit: 2022-05-27 | Discharge: 2022-05-27 | Disposition: A | Discharge status: Home / Self Care | Payer: BLUE CROSS/BLUE SHIELD

## 2022-05-27 DIAGNOSIS — N644 Mastodynia: Secondary | ICD-10-CM

## 2022-05-27 NOTE — ED Notes (Signed)
Rash on Right breast, noticed it yesterday, Redness, The spot does not hurt but the whole breast is tender to touch and has a warm feeling and patient states breast feels "heavy". Felt some sharp pains last week but there was not rash.  Pain 4/10 Achy pain,

## 2022-05-27 NOTE — ED Triage Notes (Addendum)
Rash on Right breast, noticed it yesterday, redness, The spot does not hurt but the whole breast is tender to touch and has a warm feeling and patient states breast feels "heavy". Felt some sharp pains last week but there was not rash.

## 2022-05-27 NOTE — ED Provider Notes (Signed)
Patient complains of redness around her right nipple that is not painful or itchy.  Patient states that at the same time, her entire right breast became tender to touch and has a warm feeling.  Patient states her right breast also feels heavy, denies similar pain symptoms on the left.  Patient states she initially called her primary care provider who is in [place], [place] who advised her to come to urgent care to be evaluated.  I advised the patient to reach out to her primary care provider to request a diagnostic mammogram as there is nothing we can do for her here at urgent care.   Theadora Rama Scales, PA-C 05/27/22 1830

## 2022-07-04 DIAGNOSIS — Z01419 Encounter for gynecological examination (general) (routine) without abnormal findings: Secondary | ICD-10-CM | POA: Diagnosis not present

## 2022-07-04 DIAGNOSIS — N926 Irregular menstruation, unspecified: Secondary | ICD-10-CM | POA: Diagnosis not present

## 2022-07-04 DIAGNOSIS — Z113 Encounter for screening for infections with a predominantly sexual mode of transmission: Secondary | ICD-10-CM | POA: Diagnosis not present

## 2022-07-04 DIAGNOSIS — Z124 Encounter for screening for malignant neoplasm of cervix: Secondary | ICD-10-CM | POA: Diagnosis not present

## 2022-07-04 DIAGNOSIS — M419 Scoliosis, unspecified: Secondary | ICD-10-CM | POA: Diagnosis not present

## 2022-08-13 ENCOUNTER — Telehealth: Payer: BLUE CROSS/BLUE SHIELD | Admitting: Family Medicine

## 2022-08-13 DIAGNOSIS — L243 Irritant contact dermatitis due to cosmetics: Secondary | ICD-10-CM | POA: Diagnosis not present

## 2022-08-13 MED ORDER — TRIAMCINOLONE ACETONIDE 0.025 % EX CREA: 1.0000 | TOPICAL_CREAM | Freq: Two times a day (BID) | CUTANEOUS | 0 refills | Status: AC

## 2022-08-13 NOTE — Progress Notes (Signed)
E Visit for Rash  We are sorry that you are not feeling well. Here is how we plan to help!  Based on what you shared with me it looks like you have contact dermatitis.  Contact dermatitis is a skin rash caused by something that touches the skin and causes irritation or inflammation.  Your skin may be red, swollen, dry, cracked, and itch.  The rash should go away in a few days but can last a few weeks.  If you get a rash, it's important to figure out what caused it so the irritant can be avoided in the future.  I will send in a topical cream for your to use in case hydrocortisone does not work. I would wash the area well from the dye product. And after washing hair, I would rewash your body to remove any residue that might come out each wash until the dye is gone.    HOME CARE:  Take cool showers and avoid direct sunlight. Apply cool compress or wet dressings. Take a bath in an oatmeal bath.  Sprinkle content of one Aveeno packet under running faucet with comfortably warm water.  Bathe for 15-20 minutes, 1-2 times daily.  Pat dry with a towel. Do not rub the rash. Use hydrocortisone cream. Take an antihistamine like Benadryl for widespread rashes that itch.  The adult dose of Benadryl is 25-50 mg by mouth 4 times daily. Caution:  This type of medication may cause sleepiness.  Do not drink alcohol, drive, or operate dangerous machinery while taking antihistamines.  Do not take these medications if you have prostate enlargement.  Read package instructions thoroughly on all medications that you take.  GET HELP RIGHT AWAY IF:  Symptoms don't go away after treatment. Severe itching that persists. If you rash spreads or swells. If you rash begins to smell. If it blisters and opens or develops a yellow-brown crust. You develop a fever. You have a sore throat. You become short of breath.  MAKE SURE YOU:  Understand these instructions. Will watch your condition. Will get help right away if you  are not doing well or get worse.  Thank you for choosing an e-visit.  Your e-visit answers were reviewed by a board certified advanced clinical practitioner to complete your personal care plan. Depending upon the condition, your plan could have included both over the counter or prescription medications.  Please review your pharmacy choice. Make sure the pharmacy is open so you can pick up prescription now. If there is a problem, you may contact your provider through [employer] and have the prescription routed to another pharmacy.  Your safety is important to [place]. If you have drug allergies check your prescription carefully.   For the next 24 hours you can use MyChart to ask questions about today's visit, request a non-urgent call back, or ask for a work or school excuse. You will get an email in the next two days asking about your experience. I hope that your e-visit has been valuable and will speed your recovery.  I provided 5 minutes of non face-to-face time during this encounter for chart review, medication and order placement, as well as and documentation.

## 2022-11-07 ENCOUNTER — Inpatient Hospital Stay (HOSPITAL_COMMUNITY): Payer: BLUE CROSS/BLUE SHIELD

## 2022-11-07 ENCOUNTER — Encounter (HOSPITAL_COMMUNITY): Payer: Self-pay | Admitting: Obstetrics & Gynecology

## 2022-11-07 ENCOUNTER — Other Ambulatory Visit: Payer: Self-pay

## 2022-11-07 ENCOUNTER — Inpatient Hospital Stay (HOSPITAL_COMMUNITY)
Admission: AD | Admit: 2022-11-07 | Discharge: 2022-11-07 | Disposition: A | Discharge status: Home / Self Care | Payer: BLUE CROSS/BLUE SHIELD | Attending: Obstetrics and Gynecology | Admitting: Obstetrics and Gynecology

## 2022-11-07 DIAGNOSIS — O209 Hemorrhage in early pregnancy, unspecified: Secondary | ICD-10-CM | POA: Diagnosis not present

## 2022-11-07 DIAGNOSIS — Z3A09 9 weeks gestation of pregnancy: Secondary | ICD-10-CM | POA: Diagnosis not present

## 2022-11-07 DIAGNOSIS — Z87891 Personal history of nicotine dependence: Secondary | ICD-10-CM | POA: Insufficient documentation

## 2022-11-07 DIAGNOSIS — Z3491 Encounter for supervision of normal pregnancy, unspecified, first trimester: Secondary | ICD-10-CM

## 2022-11-07 DIAGNOSIS — O26851 Spotting complicating pregnancy, first trimester: Secondary | ICD-10-CM

## 2022-11-07 DIAGNOSIS — Z3A01 Less than 8 weeks gestation of pregnancy: Secondary | ICD-10-CM

## 2022-11-07 DIAGNOSIS — O26891 Other specified pregnancy related conditions, first trimester: Secondary | ICD-10-CM | POA: Insufficient documentation

## 2022-11-07 DIAGNOSIS — R1030 Lower abdominal pain, unspecified: Secondary | ICD-10-CM | POA: Diagnosis not present

## 2022-11-07 DIAGNOSIS — R1031 Right lower quadrant pain: Secondary | ICD-10-CM | POA: Diagnosis not present

## 2022-11-07 LAB — CBC
HCT: 42.3 % (ref 36.0–46.0)
Hemoglobin: 13.8 g/dL (ref 12.0–15.0)
MCH: 28.4 pg (ref 26.0–34.0)
MCHC: 32.6 g/dL (ref 30.0–36.0)
MCV: 87 fL (ref 80.0–100.0)
Platelets: 241 10*3/uL (ref 150–400)
RBC: 4.86 MIL/uL (ref 3.87–5.11)
RDW: 14.4 % (ref 11.5–15.5)
WBC: 6.4 10*3/uL (ref 4.0–10.5)
nRBC: 0 % (ref 0.0–0.2)

## 2022-11-07 LAB — URINALYSIS, ROUTINE W REFLEX MICROSCOPIC
Bacteria, UA: NONE SEEN
Bilirubin Urine: NEGATIVE
Glucose, UA: NEGATIVE mg/dL
Hgb urine dipstick: NEGATIVE
Ketones, ur: NEGATIVE mg/dL
Nitrite: NEGATIVE
Protein, ur: NEGATIVE mg/dL
Specific Gravity, Urine: 1.024 (ref 1.005–1.030)
pH: 5 (ref 5.0–8.0)

## 2022-11-07 LAB — WET PREP, GENITAL
Sperm: NONE SEEN
Trich, Wet Prep: NONE SEEN
WBC, Wet Prep HPF POC: >=10 — AB (ref <10)
Yeast Wet Prep HPF POC: NONE SEEN

## 2022-11-07 LAB — HCG, QUANTITATIVE, PREGNANCY: hCG, Beta Chain, Quant, S: 11825 m[IU]/mL — ABNORMAL HIGH (ref <5)

## 2022-11-07 LAB — POCT PREGNANCY, URINE: Preg Test, Ur: POSITIVE — AB

## 2022-11-07 NOTE — Discharge Instructions (Signed)

## 2022-11-07 NOTE — MAU Provider Note (Signed)
History     CSN: [id number]  Arrival date and time: 11/07/22 1801   Event Date/Time   First Provider Initiated Contact with Patient 11/07/22 2013      Chief Complaint  Patient presents with   Abdominal Pain   Vaginal Bleeding   HPI  Christina Reese is a 26 y.o. G3P1011 at [redacted]w[redacted]d who presents for evaluation of spotting. Patient reports she has had intermittent spotting for more than 2 weeks. She also reports lower abdominal cramping that is sharp and shooting in nature.  Patient rates the pain as a 6/10 and has not tried anything for the pain. She denies any discharge and leaking of fluid. Denies any constipation, diarrhea or any urinary complaints.   OB History     Gravida  3   Para  1   Term  1   Preterm      AB  1   Living  1      SAB  1   IAB      Ectopic      Multiple  0   Live Births  1           Past Medical History:  Diagnosis Date   Anxiety    Depression    stop meds with medication   Scoliosis     Past Surgical History:  Procedure Laterality Date   ABDOMINAL SURGERY     diagnositic for suspected ectopic   BREAST SURGERY  03/2020   Breast reduction   CESAREAN SECTION N/A 04/11/2021   Procedure: CESAREAN SECTION;  Surgeon: Jaymes Graff, MD;  Location: [facility];  Service: Obstetrics;  Laterality: N/A;   TONSILLECTOMY      Family History  Problem Relation Age of Onset   Healthy Father    Diabetes Maternal Uncle    Diabetes Paternal Uncle    Hypertension Maternal Grandmother     Social History   Tobacco Use   Smoking status: Former    Types: Cigarettes   Smokeless tobacco: Never   Tobacco comments:    quit 1 yr before preganancy  Vaping Use   Vaping Use: Never used  Substance Use Topics   Alcohol use: Not Currently    Comment: occasionally   Drug use: No    Allergies:  Allergies  Allergen Reactions   Latex     Swelling in face, itching and rash   Sulfa Antibiotics Rash    No medications prior to admission.     Review of Systems  Constitutional: Negative.  Negative for fatigue and fever.  HENT: Negative.    Respiratory: Negative.  Negative for shortness of breath.   Cardiovascular: Negative.  Negative for chest pain.  Gastrointestinal:  Positive for abdominal pain. Negative for constipation, diarrhea, nausea and vomiting.  Genitourinary:  Positive for vaginal bleeding. Negative for dysuria.  Neurological: Negative.  Negative for dizziness and headaches.   Physical Exam   Blood pressure 121/72, pulse (!) 109, temperature 98.5 F (36.9 C), temperature source Oral, resp. rate 16, height 5\' 3"  (1.6 m), weight 101.5 kg, last menstrual period 08/31/2022, SpO2 99 %, not currently breastfeeding.  Patient Vitals for the past 24 hrs:  BP Temp Temp src Pulse Resp SpO2 Height Weight  11/07/22 1832 121/72 98.5 F (36.9 C) Oral (!) 109 16 99 % -- --  11/07/22 1828 -- -- -- -- -- -- 5\' 3"  (1.6 m) 101.5 kg    Physical Exam Vitals and nursing note reviewed.  Constitutional:  General: She is not in acute distress.    Appearance: She is well-developed.  HENT:     Head: Normocephalic.  Eyes:     Pupils: Pupils are equal, round, and reactive to light.  Cardiovascular:     Rate and Rhythm: Normal rate and regular rhythm.     Heart sounds: Normal heart sounds.  Pulmonary:     Effort: Pulmonary effort is normal. No respiratory distress.     Breath sounds: Normal breath sounds.  Abdominal:     General: Bowel sounds are normal. There is no distension.     Palpations: Abdomen is soft.     Tenderness: There is no abdominal tenderness.  Skin:    General: Skin is warm and dry.  Neurological:     Mental Status: She is alert and oriented to person, place, and time.  Psychiatric:        Mood and Affect: Mood normal.        Behavior: Behavior normal.        Thought Content: Thought content normal.        Judgment: Judgment normal.      MAU Course  Procedures  Results for orders placed or  performed during the hospital encounter of 11/07/22 (from the past 24 hour(s))  Pregnancy, urine POC     Status: Abnormal   Collection Time: 11/07/22  6:37 PM  Result Value Ref Range   Preg Test, Ur POSITIVE (A) NEGATIVE  Urinalysis, Routine w reflex microscopic Urine, Clean Catch     Status: Abnormal   Collection Time: 11/07/22  6:40 PM  Result Value Ref Range   Color, Urine YELLOW YELLOW   APPearance CLEAR CLEAR   Specific Gravity, Urine 1.024 1.005 - 1.030   pH 5.0 5.0 - 8.0   Glucose, UA NEGATIVE NEGATIVE mg/dL   Hgb urine dipstick NEGATIVE NEGATIVE   Bilirubin Urine NEGATIVE NEGATIVE   Ketones, ur NEGATIVE NEGATIVE mg/dL   Protein, ur NEGATIVE NEGATIVE mg/dL   Nitrite NEGATIVE NEGATIVE   Leukocytes,Ua TRACE (A) NEGATIVE   RBC / HPF 0-5 0 - 5 RBC/hpf   WBC, UA 0-5 0 - 5 WBC/hpf   Bacteria, UA NONE SEEN NONE SEEN   Squamous Epithelial / LPF 0-5 0 - 5 /HPF   Mucus PRESENT   CBC     Status: None   Collection Time: 11/07/22  7:17 PM  Result Value Ref Range   WBC 6.4 4.0 - 10.5 K/uL   RBC 4.86 3.87 - 5.11 MIL/uL   Hemoglobin 13.8 12.0 - 15.0 g/dL   HCT 76.2 83.1 - 51.7 %   MCV 87.0 80.0 - 100.0 fL   MCH 28.4 26.0 - 34.0 pg   MCHC 32.6 30.0 - 36.0 g/dL   RDW 61.6 07.3 - 71.0 %   Platelets 241 150 - 400 K/uL   nRBC 0.0 0.0 - 0.2 %  hCG, quantitative, pregnancy     Status: Abnormal   Collection Time: 11/07/22  7:17 PM  Result Value Ref Range   hCG, Beta Chain, Quant, S 11,825 (H) <5 mIU/mL  Wet prep, genital     Status: Abnormal   Collection Time: 11/07/22  8:09 PM   Specimen: Vaginal  Result Value Ref Range   Yeast Wet Prep HPF POC NONE SEEN NONE SEEN   Trich, Wet Prep NONE SEEN NONE SEEN   Clue Cells Wet Prep HPF POC PRESENT (A) NONE SEEN   WBC, Wet Prep HPF POC >=10 (A) <10   Sperm  NONE SEEN      US OB LESS THAN 14 WEEKS WITH OB TRANSVAGINAL  Result Date: 11/07/2022 CLINICAL DATA:  Right lower quadrant pain, spotting for 2 weeks, pregnant EXAM: OBSTETRIC <14 WK  [place] AND TRANSVAGINAL OB US TECHNIQUE: Both transabdominal and transvaginal ultrasound examinations were performed for complete evaluation of the gestation as well as the maternal uterus, adnexal regions, and pelvic cul-de-sac. Transvaginal technique was performed to assess early pregnancy. COMPARISON:  None Available. FINDINGS: Intrauterine gestational sac: Single Yolk sac:  Visualized. Embryo:  Visualized. Cardiac Activity: Visualized. Heart Rate: 133 bpm CRL:  10.0 mm   7 w   1 d                  [place] EDC: 06/25/2023 Subchorionic hemorrhage:  None Maternal uterus/adnexae: The bilateral ovaries are unremarkable. No free fluid. IMPRESSION: 1. Single live intrauterine pregnancy as above estimated age 41 weeks and 1 day. Electronically Signed   By: Sharlet Salina M.D.   On: 11/07/2022 20:09     MDM Labs ordered and reviewed.   UA, UPT CBC, HCG ABO/Rh- O Pos Wet prep and gc/chlamydia US OB Comp Less 14 weeks with Transvaginal  Wet prep with clue cells but patient denies odor or abnormal discharge. Will hold off on treatment at this time  CNM independently reviewed the imaging ordered. Imaging show live IUP measuring 7 weeks  Assessment and Plan   1. Normal intrauterine pregnancy on prenatal ultrasound in first trimester   2. [redacted] weeks gestation of pregnancy   3. Spotting affecting pregnancy in first trimester     -Discharge home in stable condition -First trimester precautions discussed -Patient advised to follow-up with OB to establish care -Patient may return to MAU as needed or if her condition were to change or worsen  Rolm Bookbinder, CNM 11/07/2022, 8:13 PM

## 2022-11-07 NOTE — MAU Note (Signed)
Christina Reese is a 26 y.o. here in MAU reporting: intermittent spotting for a little bit more then 2 weeks. + UPT prior to the spotting. Right lower abdominal sharp pains that are constant. Also has a cough.   LMP: 08/31/22  Onset of complaint: ongoing  Pain score: 6/10  Vitals:   11/07/22 1832  BP: 121/72  Pulse: (!) 109  Resp: 16  Temp: 98.5 F (36.9 C)  SpO2: 99%     FHT:NA  Lab orders placed from triage: upt

## 2022-11-08 LAB — GC/CHLAMYDIA PROBE AMP (~~LOC~~) NOT AT ARMC
Chlamydia: NEGATIVE
Comment: NEGATIVE
Comment: NORMAL
Neisseria Gonorrhea: NEGATIVE

## 2022-11-09 DIAGNOSIS — R051 Acute cough: Secondary | ICD-10-CM | POA: Diagnosis not present

## 2022-11-09 DIAGNOSIS — H6592 Unspecified nonsuppurative otitis media, left ear: Secondary | ICD-10-CM | POA: Diagnosis not present

## 2022-11-09 DIAGNOSIS — R0981 Nasal congestion: Secondary | ICD-10-CM | POA: Diagnosis not present

## 2022-11-09 DIAGNOSIS — R062 Wheezing: Secondary | ICD-10-CM | POA: Diagnosis not present

## 2022-11-12 ENCOUNTER — Inpatient Hospital Stay (HOSPITAL_COMMUNITY): Payer: BLUE CROSS/BLUE SHIELD

## 2022-11-12 ENCOUNTER — Inpatient Hospital Stay (HOSPITAL_COMMUNITY)
Admission: AD | Admit: 2022-11-12 | Discharge: 2022-11-12 | Disposition: A | Discharge status: Home / Self Care | Payer: BLUE CROSS/BLUE SHIELD | Attending: Obstetrics and Gynecology | Admitting: Obstetrics and Gynecology

## 2022-11-12 DIAGNOSIS — O209 Hemorrhage in early pregnancy, unspecified: Secondary | ICD-10-CM | POA: Insufficient documentation

## 2022-11-12 DIAGNOSIS — Z3A01 Less than 8 weeks gestation of pregnancy: Secondary | ICD-10-CM | POA: Diagnosis not present

## 2022-11-12 DIAGNOSIS — M549 Dorsalgia, unspecified: Secondary | ICD-10-CM | POA: Diagnosis not present

## 2022-11-12 DIAGNOSIS — O98511 Other viral diseases complicating pregnancy, first trimester: Secondary | ICD-10-CM | POA: Diagnosis not present

## 2022-11-12 DIAGNOSIS — O99511 Diseases of the respiratory system complicating pregnancy, first trimester: Secondary | ICD-10-CM | POA: Insufficient documentation

## 2022-11-12 DIAGNOSIS — J069 Acute upper respiratory infection, unspecified: Secondary | ICD-10-CM | POA: Insufficient documentation

## 2022-11-12 DIAGNOSIS — Z3A00 Weeks of gestation of pregnancy not specified: Secondary | ICD-10-CM

## 2022-11-12 DIAGNOSIS — O26891 Other specified pregnancy related conditions, first trimester: Secondary | ICD-10-CM | POA: Insufficient documentation

## 2022-11-12 DIAGNOSIS — O99891 Other specified diseases and conditions complicating pregnancy: Secondary | ICD-10-CM | POA: Diagnosis not present

## 2022-11-12 LAB — URINALYSIS, MICROSCOPIC (REFLEX): RBC / HPF: >50 RBC/hpf (ref 0–5)

## 2022-11-12 LAB — URINALYSIS, ROUTINE W REFLEX MICROSCOPIC

## 2022-11-12 NOTE — MAU Note (Signed)
.  Christina Reese is a 27 y.o. at [redacted]w[redacted]d here in MAU reporting: has been spotting for weeks but for the last two days she has been passing dark red clots the size of a quarter. Has lower abdominal cramping as well. No recent IC. Also has been sick since 12/21 with cough and runny nose. Went to urgent care over the weekend and they gave her azithromycin and tessalon pearls. Also has an ear infection.   Pain score: 8 Vitals:   11/12/22 1338  BP: 123/81  Pulse: (!) 106  Resp: 20  Temp: 99 F (37.2 C)  SpO2: 98%      Lab orders placed from triage:  UA

## 2022-11-12 NOTE — MAU Provider Note (Signed)
History     CSN: [id number]  Arrival date and time: 11/12/22 1302   Event Date/Time   First Provider Initiated Contact with Patient 11/12/22 1718      Chief Complaint  Patient presents with   Abdominal Pain   Vaginal Bleeding   Nasal Congestion   Cough   Abdominal Pain Pertinent negatives include no diarrhea, dysuria, fever, nausea or vomiting.  Vaginal Bleeding Associated symptoms include abdominal pain. Pertinent negatives include no back pain, chills, diarrhea, dysuria, fever, flank pain, nausea, rash, sore throat or vomiting.  Cough Pertinent negatives include no chest pain, chills, fever, rash, sore throat or shortness of breath.    Patient is 27 y.o. [id number] [redacted]w[redacted]d here with complaints of vaginal bleeding present since 12/18. She reports it has been constant and she has been evaluated previously for this on 12/28. She reports passage of small clots and reports some mild cramping that is 2-3/10 in nature. Meha is having back pain that is aching across her lower back bilaterally. No radiation to her legs. Denies loss of sensation in legs or tingling/paresthesias in legs. No loss of bowel or bladder function. Cough and congestion started on 12/21 and patient reports negative testing.   Denies LOF, contractions, vaginal discharge.   OB History     Gravida  3   Para  1   Term  1   Preterm      AB  1   Living  1      SAB  1   IAB      Ectopic      Multiple  0   Live Births  1           Past Medical History:  Diagnosis Date   Anxiety    Depression    stop meds with medication   Scoliosis     Past Surgical History:  Procedure Laterality Date   ABDOMINAL SURGERY     diagnositic for suspected ectopic   BREAST SURGERY  03/2020   Breast reduction   CESAREAN SECTION N/A 04/11/2021   Procedure: CESAREAN SECTION;  Surgeon: Crawford Givens, MD;  Location: [facility];  Service: Obstetrics;  Laterality: N/A;   TONSILLECTOMY      Family History   Problem Relation Age of Onset   Healthy Father    Diabetes Maternal Uncle    Diabetes Paternal Uncle    Hypertension Maternal Grandmother     Social History   Tobacco Use   Smoking status: Former    Types: Cigarettes   Smokeless tobacco: Never   Tobacco comments:    quit 1 yr before preganancy  Vaping Use   Vaping Use: Never used  Substance Use Topics   Alcohol use: Not Currently    Comment: occasionally   Drug use: No    Allergies:  Allergies  Allergen Reactions   Latex     Swelling in face, itching and rash   Sulfa Antibiotics Rash    No medications prior to admission.    Review of Systems  Constitutional:  Negative for chills and fever.  HENT:  Negative for congestion and sore throat.   Eyes:  Negative for pain and visual disturbance.  Respiratory:  Positive for cough. Negative for chest tightness and shortness of breath.   Cardiovascular:  Negative for chest pain.  Gastrointestinal:  Positive for abdominal pain. Negative for diarrhea, nausea and vomiting.  Endocrine: Negative for cold intolerance and heat intolerance.  Genitourinary:  Positive for vaginal bleeding. Negative  for dysuria and flank pain.  Musculoskeletal:  Negative for back pain.  Skin:  Negative for rash.  Allergic/Immunologic: Negative for food allergies.  Neurological:  Negative for dizziness and light-headedness.  Psychiatric/Behavioral:  Negative for agitation.    Physical Exam   Blood pressure 123/81, pulse (!) 106, temperature 99 F (37.2 C), temperature source Oral, resp. rate 20, weight 99.3 kg, last menstrual period 08/31/2022, SpO2 98 %, not currently breastfeeding.  Physical Exam Vitals and nursing note reviewed.  Constitutional:      General: She is not in acute distress.    Appearance: She is well-developed.  HENT:     Head: Normocephalic and atraumatic.  Eyes:     General: No scleral icterus.    Conjunctiva/sclera: Conjunctivae normal.  Cardiovascular:     Rate and  Rhythm: Normal rate.  Pulmonary:     Effort: Pulmonary effort is normal.  Chest:     Chest wall: No tenderness.  Abdominal:     General: Abdomen is flat.     Palpations: Abdomen is soft.     Tenderness: There is no abdominal tenderness. There is no guarding or rebound.  Genitourinary:    Vagina: Normal.  Musculoskeletal:        General: Normal range of motion.     Cervical back: Normal range of motion and neck supple.  Skin:    General: Skin is warm and dry.     Findings: No rash.  Neurological:     Mental Status: She is alert and oriented to person, place, and time.     Cranial Nerves: Cranial nerves 2-12 are intact.     Motor: Motor function is intact.     Coordination: Coordination is intact.     Gait: Gait is intact.     Comments: Walks without assistance     MAU Course  Procedures - [place]- confirmed IUP  MDM Moderate  Assessment and Plan  1. Vaginal bleeding affecting early pregnancy - Discharge patient  2. Viral URI with cough  3. Back pain affecting pregnancy in first trimester - IUP with FHR at 171 confirmed - Patient is ambivalent about keeping this pregnancy, was crying during discussion that this is a viable pregnancy. She is considering termination. Discussed options locally for services. Patient is currently [redacted]w[redacted]d and may not be eligible for medication induced AB at this point. Provided support for whatever choice is right for her.  - Patient declined images from her ultrasound.   2. Viral URI- seen recently for this same complaint. Discussed supportive treatment  3. Back pain- likely unrelated to pregnancy. MSK in origin. Recommended heat, massage, tylenol for symptom relief.   Juanita Craver [facility] 11/12/2022, 5:34 PM

## 2022-11-26 ENCOUNTER — Inpatient Hospital Stay (HOSPITAL_COMMUNITY)
Admission: AD | Admit: 2022-11-26 | Discharge: 2022-11-26 | Disposition: A | Discharge status: Home / Self Care | Payer: BC Managed Care – PPO | Attending: Obstetrics and Gynecology | Admitting: Obstetrics and Gynecology

## 2022-11-26 ENCOUNTER — Inpatient Hospital Stay (HOSPITAL_COMMUNITY): Payer: BC Managed Care – PPO

## 2022-11-26 DIAGNOSIS — O039 Complete or unspecified spontaneous abortion without complication: Secondary | ICD-10-CM | POA: Diagnosis not present

## 2022-11-26 DIAGNOSIS — O209 Hemorrhage in early pregnancy, unspecified: Secondary | ICD-10-CM | POA: Diagnosis not present

## 2022-11-26 DIAGNOSIS — Z3A09 9 weeks gestation of pregnancy: Secondary | ICD-10-CM | POA: Diagnosis not present

## 2022-11-26 DIAGNOSIS — Z3A00 Weeks of gestation of pregnancy not specified: Secondary | ICD-10-CM | POA: Diagnosis not present

## 2022-11-26 LAB — HCG, QUANTITATIVE, PREGNANCY: hCG, Beta Chain, Quant, S: 51 m[IU]/mL — ABNORMAL HIGH (ref <5)

## 2022-11-26 NOTE — MAU Note (Signed)
.  Christina Reese is a 27 y.o. at [redacted]w[redacted]d here in MAU reporting: has had heavy vaginal bleeding from December 28-Jan 15. Bleeding stopped  yesterday. Denies any pain or cramping. Wants to see if sh has had a miscarriage.  LMP: 08/31/22 Onset of complaint: 2 weeks Pain score: 0 Vitals:   11/26/22 1835  BP: 104/70  Pulse: 90  Resp: 18  Temp: 98.5 F (36.9 C)     FHT:n/a Lab orders placed from triage:

## 2022-11-26 NOTE — MAU Note (Signed)
Dr. Ernestina Patches at bedside for U/S.

## 2022-11-26 NOTE — MAU Provider Note (Addendum)
History     CSN: [id number]  Arrival date and time: 11/26/22 1742   Event Date/Time   First Provider Initiated Contact with Patient 11/26/22 1855      Chief Complaint  Patient presents with   Vaginal Bleeding   Vaginal Bleeding Pertinent negatives include no abdominal pain, back pain, chills, diarrhea, dysuria, fever, flank pain, nausea, rash, sore throat or vomiting.    Patient is 27 y.o. [id number] [redacted]w[redacted]d here with complaints of vaginal bleeding all last week with passage of clots and significant bleeding. She was seen in the MAU on 1/2 and has a live IUP. She reports since this visit and has significant bleeding with clots and maybe passage of tissue. Denies bleeding currently and also denies abdominal pain or cramping currently.    OB History     Gravida  3   Para  1   Term  1   Preterm      AB  1   Living  1      SAB  1   IAB      Ectopic      Multiple  0   Live Births  1           Past Medical History:  Diagnosis Date   Anxiety    Depression    stop meds with medication   Scoliosis     Past Surgical History:  Procedure Laterality Date   ABDOMINAL SURGERY     diagnositic for suspected ectopic   BREAST SURGERY  03/2020   Breast reduction   CESAREAN SECTION N/A 04/11/2021   Procedure: CESAREAN SECTION;  Surgeon: Crawford Givens, MD;  Location: [facility];  Service: Obstetrics;  Laterality: N/A;   TONSILLECTOMY      Family History  Problem Relation Age of Onset   Healthy Father    Diabetes Maternal Uncle    Diabetes Paternal Uncle    Hypertension Maternal Grandmother     Social History   Tobacco Use   Smoking status: Former    Types: Cigarettes   Smokeless tobacco: Never   Tobacco comments:    quit 1 yr before preganancy  Vaping Use   Vaping Use: Never used  Substance Use Topics   Alcohol use: Not Currently    Comment: occasionally   Drug use: No    Allergies:  Allergies  Allergen Reactions   Latex     Swelling in face,  itching and rash   Sulfa Antibiotics Rash    Medications Prior to Admission  Medication Sig Dispense Refill Last Dose   triamcinolone (KENALOG) 0.025 % cream Apply 1 Application topically 2 (two) times daily. 30 g 0     Review of Systems  Constitutional:  Negative for chills and fever.  HENT:  Negative for congestion and sore throat.   Eyes:  Negative for pain and visual disturbance.  Respiratory:  Negative for cough, chest tightness and shortness of breath.   Cardiovascular:  Negative for chest pain.  Gastrointestinal:  Negative for abdominal pain, diarrhea, nausea and vomiting.  Endocrine: Negative for cold intolerance and heat intolerance.  Genitourinary:  Positive for vaginal bleeding. Negative for dysuria and flank pain.  Musculoskeletal:  Negative for back pain.  Skin:  Negative for rash.  Allergic/Immunologic: Negative for food allergies.  Neurological:  Negative for dizziness and light-headedness.  Psychiatric/Behavioral:  Negative for agitation.    Physical Exam   Blood pressure 104/70, pulse 90, temperature 98.5 F (36.9 C), resp. rate 18, height 5'  3" (1.6 m), weight 97.5 kg, last menstrual period 08/31/2022, not currently breastfeeding.  Physical Exam Vitals and nursing note reviewed.  Constitutional:      General: She is not in acute distress.    Appearance: She is well-developed.     Comments: Pregnant female  HENT:     Head: Normocephalic and atraumatic.  Eyes:     General: No scleral icterus.    Conjunctiva/sclera: Conjunctivae normal.  Cardiovascular:     Rate and Rhythm: Normal rate.  Pulmonary:     Effort: Pulmonary effort is normal.  Chest:     Chest wall: No tenderness.  Abdominal:     Palpations: Abdomen is soft.     Tenderness: There is no abdominal tenderness. There is no guarding or rebound.     Comments: Obese  Genitourinary:    Vagina: Normal.  Musculoskeletal:        General: Normal range of motion.     Cervical back: Normal range of  motion and neck supple.  Skin:    General: Skin is warm and dry.     Findings: No rash.  Neurological:     Mental Status: She is alert and oriented to person, place, and time.    Bedside US:  Reviewed limited nature of US transabdominally I performed a bedside US that showed no gestational sac in the uterus and I could not identify a fetus. I discussed this findings in real time with the patient and my recommendation for transvaginal US as mine was only abdominal.   US OB Comp Less 14 Wks  Result Date: 11/26/2022 CLINICAL DATA:  Vaginal bleeding EXAM: OBSTETRIC <14 WK ULTRASOUND TECHNIQUE: Transabdominal ultrasound was performed for evaluation of the gestation as well as the maternal uterus and adnexal regions. COMPARISON:  11/12/2022 FINDINGS: Intrauterine gestational sac: None Yolk sac:  Not Visualized. Embryo:  Not Visualized. Cardiac Activity: Not Visualized. Heart Rate:  bpm MSD:    mm    w     d CRL:     mm    w  d                  [place] EDC: Subchorionic hemorrhage:  None visualized. Maternal uterus/adnexae: No adnexal mass or free fluid. Endometrium 4 mm in thickness. Patient declined transvaginal imaging. IMPRESSION: Previously seen intrauterine gestation no longer visualized compatible with spontaneous abortion. No evidence of retained products of conception. Electronically Signed   By: Rolm Baptise M.D.   On: 11/26/2022 20:05    MAU Course  Procedures  MDM- moderate  Reviewed lab results which shows that bhcg has dropped from 11,825 to now 51.   Results for orders placed or performed during the hospital encounter of 11/26/22 (from the past 24 hour(s))  hCG, quantitative, pregnancy     Status: Abnormal   Collection Time: 11/26/22  7:15 PM  Result Value Ref Range   hCG, Beta Chain, Quant, S 51 (H) <5 mIU/mL    Assessment and Plan   Complete miscarriage Spontaneous abortion  Formal US confirmed my finding of Complete miscarriage, no POC in uterus Provided support to  patient Discussed contraceptive options- patient has 3 packs of OCPs at home and will restart these. She was having nausea and we discussed taking pills right before bed to reduce nausea. She declined additional pills prescribed today She plans to make an appt with her OB/GYN Dr Guy Begin 11/26/2022, 7:22 PM

## 2022-11-26 NOTE — MAU Note (Signed)
Dr Ernestina Patches in Triage to discuss test results and d/c plan with pt

## 2023-04-26 ENCOUNTER — Telehealth: Payer: BC Managed Care – PPO | Admitting: Physician Assistant

## 2023-04-26 DIAGNOSIS — R21 Rash and other nonspecific skin eruption: Secondary | ICD-10-CM | POA: Diagnosis not present

## 2023-04-26 MED ORDER — MUPIROCIN 2 % EX OINT: 1.0000 | TOPICAL_OINTMENT | Freq: Two times a day (BID) | CUTANEOUS | 0 refills | Status: AC

## 2023-04-26 NOTE — Progress Notes (Signed)
E Visit for Rash  We are sorry that you are not feeling well. Here is how we plan to help!  Your rash looks most consistent with an infection called impetigo. This is usually treated resolves with topical antibiotics. I have prescribed Mupirocen ointment to use for the infection.    HOME CARE:  Take cool showers and avoid direct sunlight. Apply cool compress or wet dressings. Take a bath in an oatmeal bath.  Sprinkle content of one Aveeno packet under running faucet with comfortably warm water.  Bathe for 15-20 minutes, 1-2 times daily.  Pat dry with a towel. Do not rub the rash. Use hydrocortisone cream. Take an antihistamine like Benadryl for widespread rashes that itch.  The adult dose of Benadryl is 25-50 mg by mouth 4 times daily. Caution:  This type of medication may cause sleepiness.  Do not drink alcohol, drive, or operate dangerous machinery while taking antihistamines.  Do not take these medications if you have prostate enlargement.  Read package instructions thoroughly on all medications that you take.  GET HELP RIGHT AWAY IF:  Symptoms don't go away after treatment. Severe itching that persists. If you rash spreads or swells. If you rash begins to smell. If it blisters and opens or develops a yellow-brown crust. You develop a fever. You have a sore throat. You become short of breath.  MAKE SURE YOU:  Understand these instructions. Will watch your condition. Will get help right away if you are not doing well or get worse.  Thank you for choosing an e-visit.  Your e-visit answers were reviewed by a board certified advanced clinical practitioner to complete your personal care plan. Depending upon the condition, your plan could have included both over the counter or prescription medications.  Please review your pharmacy choice. Make sure the pharmacy is open so you can pick up prescription now. If there is a problem, you may contact your provider through [employer]  and have the prescription routed to another pharmacy.  Your safety is important to [place]. If you have drug allergies check your prescription carefully.   For the next 24 hours you can use MyChart to ask questions about today's visit, request a non-urgent call back, or ask for a work or school excuse. You will get an email in the next two days asking about your experience. I hope that your e-visit has been valuable and will speed your recovery.  I have spent 5 minutes in review of e-visit questionnaire, review and updating patient chart, medical decision making and response to patient.   Tylene Fantasia Ward, PA-C

## 2023-10-15 DIAGNOSIS — Z708 Other sex counseling: Secondary | ICD-10-CM | POA: Diagnosis not present

## 2023-10-15 DIAGNOSIS — Z6837 Body mass index (BMI) 37.0-37.9, adult: Secondary | ICD-10-CM | POA: Diagnosis not present

## 2023-10-15 DIAGNOSIS — Z113 Encounter for screening for infections with a predominantly sexual mode of transmission: Secondary | ICD-10-CM | POA: Diagnosis not present

## 2023-12-26 ENCOUNTER — Ambulatory Visit: Payer: Self-pay

## 2024-01-20 ENCOUNTER — Telehealth: Admitting: Physician Assistant

## 2024-01-20 DIAGNOSIS — L989 Disorder of the skin and subcutaneous tissue, unspecified: Secondary | ICD-10-CM

## 2024-01-20 NOTE — Progress Notes (Signed)
 Message sent to patient requesting further input regarding current symptoms. Awaiting patient response.

## 2024-01-21 NOTE — Progress Notes (Signed)
  Because the lesion is recurrent and not resolving with Mupriocin ointment, I feel your condition warrants further evaluation and I recommend that you be seen in a face-to-face visit.   NOTE: There will be NO CHARGE for this E-Visit   If you are having a true medical emergency, please call 911.     For an urgent face to face visit, [facility] has multiple urgent care centers for your convenience.  Click the link below for the full list of locations and hours, walk-in wait times, appointment scheduling options and driving directions:  Urgent Care - [place], [place], [place], [place], [place], [place]  [place]     Your MyChart E-visit questionnaire answers were reviewed by a board certified advanced clinical practitioner to complete your personal care plan based on your specific symptoms.    Thank you for using e-Visits.   I have spent 5 minutes in review of e-visit questionnaire, review and updating patient chart, medical decision making and response to patient.   Margaretann Loveless, PA-C
